# Patient Record
Sex: Male | Born: 1950 | Race: White | Hispanic: No | Smoking: Former smoker
Health system: Southern US, Community
[De-identification: ages and names within clinical notes are randomized; demographics above are authoritative.]

## PROBLEM LIST (undated history)

## (undated) DIAGNOSIS — A6000 Herpesviral infection of urogenital system, unspecified: Secondary | ICD-10-CM

## (undated) DIAGNOSIS — J449 Chronic obstructive pulmonary disease, unspecified: Secondary | ICD-10-CM

## (undated) DIAGNOSIS — E785 Hyperlipidemia, unspecified: Secondary | ICD-10-CM

## (undated) DIAGNOSIS — Z720 Tobacco use: Secondary | ICD-10-CM

## (undated) DIAGNOSIS — F1290 Cannabis use, unspecified, uncomplicated: Secondary | ICD-10-CM

## (undated) DIAGNOSIS — D369 Benign neoplasm, unspecified site: Secondary | ICD-10-CM

## (undated) DIAGNOSIS — N529 Male erectile dysfunction, unspecified: Secondary | ICD-10-CM

## (undated) HISTORY — DX: Benign neoplasm, unspecified site: D36.9

## (undated) HISTORY — DX: Herpesviral infection of urogenital system, unspecified: A60.00

## (undated) HISTORY — DX: Tobacco use: Z72.0

## (undated) HISTORY — DX: Hyperlipidemia, unspecified: E78.5

## (undated) HISTORY — PX: OTHER SURGICAL HISTORY: SHX169

## (undated) HISTORY — PX: CYST EXCISION: SHX5701

## (undated) HISTORY — PX: POLYPECTOMY: SHX149

## (undated) HISTORY — DX: Cannabis use, unspecified, uncomplicated: F12.90

## (undated) HISTORY — DX: Chronic obstructive pulmonary disease, unspecified: J44.9

## (undated) HISTORY — DX: Male erectile dysfunction, unspecified: N52.9

---

## 1973-08-14 HISTORY — PX: APPENDECTOMY: SHX54

## 2003-02-06 ENCOUNTER — Encounter (HOSPITAL_BASED_OUTPATIENT_CLINIC_OR_DEPARTMENT_OTHER): Payer: Self-pay | Admitting: General Surgery

## 2003-02-11 ENCOUNTER — Ambulatory Visit (HOSPITAL_COMMUNITY): Admission: RE | Admit: 2003-02-11 | Discharge: 2003-02-11 | Payer: Self-pay | Admitting: General Surgery

## 2007-11-15 ENCOUNTER — Encounter (HOSPITAL_BASED_OUTPATIENT_CLINIC_OR_DEPARTMENT_OTHER): Payer: Self-pay | Admitting: General Surgery

## 2007-11-15 ENCOUNTER — Ambulatory Visit (HOSPITAL_COMMUNITY): Admission: RE | Admit: 2007-11-15 | Discharge: 2007-11-15 | Payer: Self-pay | Admitting: General Surgery

## 2010-12-27 NOTE — Op Note (Signed)
NAME:  Keith Curtis, Keith Curtis                  ACCOUNT NO.:  [id number]   MEDICAL RECORD NO.:  [id number]          PATIENT TYPE:  AMB   LOCATION:  SDS                          FACILITY:  [facility]   PHYSICIAN:  Leonie Man, M.D.   DATE OF BIRTH:  09-22-1950   DATE OF PROCEDURE:  11/15/2007  DATE OF DISCHARGE:                               OPERATIVE REPORT   PREOPERATIVE DIAGNOSIS:  Right inguinal hernia   POSTOPERATIVE DIAGNOSIS:  Direct and indirect right inguinal hernias.   PROCEDURES:  Right inguinal hernia repair with mesh.   SURGEON:  Dr. Lurene Shadow.   ASSISTANT:  OR tech.   ANESTHESIA:  Is general.   SPECIMENS:  To the lab:  Inguinal hernia sac.  Gross diagnosis only.   ESTIMATED BLOOD LOSS:  Was minimal.   There were no complications.   NOTE:  Keith Curtis is a 60 year old man who is status post left inguinal  hernia repair in 2007.  He was recently noted to have right-sided groin  mass developing since December 2008.  This has become larger over the  ensuing period of time.  He comes to the operating room now for repair,  after the risks and potential benefits of surgery have been fully  discussed, all questions answered and consent obtained.   PROCEDURE:  The patient is positioned supinely, and following induction  of satisfactory general anesthesia, the right groin is prepped and  draped to be included in a sterile operative field.  Positive  identification of the patient is Keith Curtis, procedure to be done right  inguinal hernia repair, was carried out.   A transverse incision of the lower abdominal crease deepened through  skin and subcutaneous tissues, carrying the dissection down to the  external oblique aponeurosis.  The external oblique aponeurosis opened  up through the external and inguinal ring with protection of the  ilioinguinal nerve which was retracted superiorly and medially.  Spermatic cord was then elevated from the floor of the inguinal canal  and held with a  Penrose drain.  There was immediately noted a very large  direct inguinal hernia, and within the contents of the cord, there was a  large indirect inguinal hernia.  The cremasteric fibers of the cord were  dissected free, and the indirect inguinal hernia was dissected free from  the spermatic cord carrying the dissection superiorly and laterally to  the internal ring.  The sac was opened and emptied of any intra-  abdominal contents.  Sac was suture ligated at its neck with a 2-0 silk  suture.  The redundant sac was amputated and forwarded for pathologic  evaluation for gross diagnosis only.  The floor of the inguinal of the  inguinal canal was then repaired with an [place] patch of polypropylene  mesh which was designed and fashioned to fit into the inguinal canal  with the leaflets split so as to allow protrusion of the spermatic cord.  This was sewn in with a 2-0 Novofil starting at the pubic tubercle and  carrying the suture line along the conjoined tendon up to the internal  ring and again from the pubic tubercle, carrying the suture line up  along the shelving edge of Poupart's ligament to the internal ring.  The  tails of the mesh were then sutured down into the internal oblique  muscle behind the cord to completely close off of the internal ring.  This was tested to be sure that the spermatic cord was not in any way  compromised of its blood supply.  The tails of mesh were then extended  onto the external oblique aponeurosis.  Sponge, instrument, and sharp  counts were doubly verified.  The external oblique aponeurosis closed  with running suture of 2-0 Vicryl.  Scarpa's fascia closed with running  suture of 3-0 Vicryl and the skin closed with running suture of 4-0  Monocryl.  This was then reinforced with Steri-Strips, sterile dressings  were applied, the anesthetic reversed, and the patient removed from the  operating room to the recovery room in stable condition.  He tolerated   the procedure well.      Leonie Man, M.D.  Electronically Signed     PB/MEDQ  D:  11/15/2007  T:  11/15/2007  Job:  [id number]   cc:   Leonie Man, M.D.

## 2010-12-30 NOTE — Op Note (Signed)
NAME:  Keith Curtis, Keith Curtis NO.:  [id number]   MEDICAL RECORD NO.:  [id number]                   PATIENT TYPE:  OIB   LOCATION:  2899                                 FACILITY:  [facility]   PHYSICIAN:  Leonie Man, M.D.                DATE OF BIRTH:  June 20, 1951   DATE OF PROCEDURE:  02/11/2003  DATE OF DISCHARGE:                                 OPERATIVE REPORT   PREOPERATIVE DIAGNOSIS:  Recurrent left inguinal hernia.   POSTOPERATIVE DIAGNOSIS:  Recurrent left inguinal hernia.   OPERATION PERFORMED:  Repair of recurrent left inguinal hernia with mesh.   SURGEON:  Leonie Man, M.D.   ASSISTANT:  Nurse.   ANESTHESIA:  General.   INDICATIONS FOR PROCEDURE:  The patient is a 60 year old man who is status  post left inguinal hernia repair approximately 19 years ago having undergone  at that time a Technical brewer.  He returns now with a left-sided groin  bulge diagnosed as a hernia.  He comes to the operating room for repair  after the risks and potential benefits of surgery had been fully discussed,  all questions answered and consent obtained.   DESCRIPTION OF PROCEDURE:  Following the induction of satisfactory general  anesthesia, the patient positioned supine, then the left groin was prepped  and draped to be included in a sterile operative field.  A transverse  incision in the lower abdominal crease through the old scar cicatrix,  deepened through the skin and subcutaneous tissue, dissecting down to the  external oblique aponeurosis.  This was opened up through the external  inguinal ring.  There was no positive identification of the ilioinguinal  nerve.  All care was taken to try to avoid that area.  The left inguinal  hernia was immediately apparent in the direct space.  The spermatic cord was  dissected free from the region of the hernia.  The spermatic cord was held  with a Penrose drain.  The hernial defect was somewhat small and  funicular  and the hernia was easily reduced into the retroperitoneum.  This area was  then secured with a single suture of 2-0 Vicryl and then an onlay patch of  polypropylene mesh was sewn over the entire area of Hesselbach's triangle,  starting at the pubic tubercle with a 2-0 Novofil suture carrying the suture  line and mesh up along the conjoined tendon up to the internal ring and  again from the pubic tubercle with a 2-0 Novofil suture along the shelving  edge of Poupart's ligament up to the internal ring.  The mesh was split so  as to allow the cord to protrude through the split and the tails of the mesh  were then trimmed and secured down into the internal oblique muscles,  posterior to the spermatic cord.  All areas of dissection were then  thoroughly checked for hemostasis.  Sponge  and instrument counts were  verified and the wound closed in layers as follows.  The spermatic cord  returned to its usual anatomic position and the external oblique aponeurosis  closed with a running suture of 2-0 Vicryl thus reapproximating the external  ring.  Scarpa's fascia and subcutaneous fat was closed with a running 3-0  Vicryl suture  and the skin was closed with a 4-0 Monocryl suture.  The wound was then  reinforced with Steri-Strips.  Sterile dressings were applied.  Anesthetic  was reversed and the patient removed from the operating room to the recovery  room in stable condition, having tolerated the procedure well.                                                Leonie Man, M.D.    PB/MEDQ  D:  02/11/2003  T:  02/11/2003  Job:  [id number]

## 2011-05-09 LAB — DIFFERENTIAL
Basophils Absolute: 0
Eosinophils Relative: 7 — ABNORMAL HIGH
Lymphocytes Relative: 25
Lymphs Abs: 2.1
Monocytes Absolute: 0.7
Neutro Abs: 5

## 2011-05-09 LAB — COMPREHENSIVE METABOLIC PANEL
AST: 21
Albumin: 3.8
BUN: 12
Calcium: 8.8
Creatinine, Ser: 1.06
GFR calc Af Amer: >60
GFR calc non Af Amer: >60
Total Bilirubin: 0.7

## 2011-05-09 LAB — CBC
HCT: 46.7
MCHC: 34.2
MCV: 91.8
Platelets: 263

## 2015-11-10 ENCOUNTER — Ambulatory Visit (INDEPENDENT_AMBULATORY_CARE_PROVIDER_SITE_OTHER): Payer: PPO

## 2015-11-10 NOTE — Patient Instructions (Signed)
     IF you received an x-ray today, you will receive an invoice from [facility]. Please contact [facility] at [phone] with questions or concerns regarding your invoice.   IF you received labwork today, you will receive an invoice from [facility]. Please contact [facility] at [phone] with questions or concerns regarding your invoice.   Our billing staff will not be able to assist you with questions regarding bills from these companies.  You will be contacted with the lab results as soon as they are available. The fastest way to get your results is to activate your My Chart account. Instructions are located on the last page of this paperwork. If you have not heard from us regarding the results in 2 weeks, please contact this office.      

## 2016-08-31 ENCOUNTER — Ambulatory Visit: Payer: Self-pay | Admitting: Family Medicine

## 2016-09-01 ENCOUNTER — Ambulatory Visit (INDEPENDENT_AMBULATORY_CARE_PROVIDER_SITE_OTHER): Payer: PPO | Admitting: Family Medicine

## 2016-09-01 ENCOUNTER — Encounter: Payer: Self-pay | Admitting: Family Medicine

## 2016-09-01 VITALS — BP 104/72 | HR 81 | Temp 97.9°F | Ht 74.0 in | Wt 217.8 lb

## 2016-09-01 DIAGNOSIS — Z136 Encounter for screening for cardiovascular disorders: Secondary | ICD-10-CM

## 2016-09-01 DIAGNOSIS — Z0001 Encounter for general adult medical examination with abnormal findings: Secondary | ICD-10-CM | POA: Diagnosis not present

## 2016-09-01 DIAGNOSIS — F17200 Nicotine dependence, unspecified, uncomplicated: Secondary | ICD-10-CM | POA: Insufficient documentation

## 2016-09-01 DIAGNOSIS — Z7289 Other problems related to lifestyle: Secondary | ICD-10-CM

## 2016-09-01 DIAGNOSIS — Z720 Tobacco use: Secondary | ICD-10-CM

## 2016-09-01 DIAGNOSIS — F1290 Cannabis use, unspecified, uncomplicated: Secondary | ICD-10-CM | POA: Diagnosis not present

## 2016-09-01 DIAGNOSIS — D1722 Benign lipomatous neoplasm of skin and subcutaneous tissue of left arm: Secondary | ICD-10-CM | POA: Insufficient documentation

## 2016-09-01 DIAGNOSIS — N529 Male erectile dysfunction, unspecified: Secondary | ICD-10-CM | POA: Insufficient documentation

## 2016-09-01 DIAGNOSIS — M1710 Unilateral primary osteoarthritis, unspecified knee: Secondary | ICD-10-CM | POA: Insufficient documentation

## 2016-09-01 DIAGNOSIS — M25562 Pain in left knee: Secondary | ICD-10-CM

## 2016-09-01 DIAGNOSIS — A6000 Herpesviral infection of urogenital system, unspecified: Secondary | ICD-10-CM | POA: Insufficient documentation

## 2016-09-01 DIAGNOSIS — G8929 Other chronic pain: Secondary | ICD-10-CM

## 2016-09-01 DIAGNOSIS — M179 Osteoarthritis of knee, unspecified: Secondary | ICD-10-CM | POA: Insufficient documentation

## 2016-09-01 MED ORDER — TETANUS-DIPHTH-ACELL PERTUSSIS 5-2.5-18.5 LF-MCG/0.5 IM SUSP
0.5000 mL | Freq: Once | INTRAMUSCULAR | 0 refills | Status: AC
Start: 2016-09-01 — End: 2016-09-01

## 2016-09-01 NOTE — Progress Notes (Signed)
Pre visit review using our clinic review tool, if applicable. No additional management support is needed unless otherwise documented below in the visit note. 

## 2016-09-01 NOTE — Progress Notes (Signed)
Phone: [phone]  Subjective:  Patient presents today to establish care. 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 Chief complaint-noted.   See problem oriented charting  The following were reviewed and entered/updated in epic: Past Medical History:  Diagnosis Date  . Erectile dysfunction    buys sildenafil from [place] or other- 40 or 60mg   . Genital herpes    last outbreak 2012 or before  . 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 Tobacco abuse    1.5 PPD. uses as a crutch. not ready to quit   Patient Active Problem List   Diagnosis Date Noted  . Tobacco abuse     Priority: High  . Marijuana use     Priority: Medium  . Genital herpes     Priority: Low  . Erectile dysfunction     Priority: Low  . Chronic pain of left knee 09/01/2016  . Lipoma of left shoulder 09/01/2016   Past Surgical History:  Procedure Laterality Date  . APPENDECTOMY  1975  . hernia bilateral     inguinal- both sides- 3 surgeries total. around 2009 last surgery.     Family History  Problem Relation Age of Onset  . Other Mother     died in 32s- unknown cause  . Heart attack Father     died at age 86  . Stroke Father     50    Medications- reviewed and updated Current Outpatient Prescriptions  Medication Sig Dispense Refill  . Tdap (BOOSTRIX) 5-2.5-18.5 LF-MCG/0.5 injection Inject 0.5 mLs into the muscle once. 0.5 mL 0   No current facility-administered medications for this visit.     Allergies-reviewed and updated No Known Allergies  Social History   Social History  . Marital status: Divorced    Spouse name: N/A  . Number of children: N/A  . Years of education: N/A   Social History Main Topics  . Smoking status: Current Every Day Smoker    Packs/day: 1.50    Years: 30.00    Types: Cigarettes  . Smokeless tobacco: Never Used  . Alcohol use 2.4 - 3.0 oz/week    4 - 5 Standard drinks or equivalent per week  . Drug use: Unknown     Comment: marijuana  .  Sexual activity: Yes     Comment: GF already has genital herpes   Other Topics Concern  . None   Social History Narrative   Divorced. GF 10 years in 2018. Son from prior marriage. No grandkids.       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       HObbies: time with gf, playing guitar, [place]. Enjoys hiking.     ROS--Full ROS was completed Review of Systems  Constitutional: Negative for chills and fever.  HENT: Negative for hearing loss and tinnitus.   Eyes: Negative for blurred vision and double vision.  Respiratory: Negative for shortness of breath and wheezing.   Cardiovascular: Negative for chest pain and palpitations.  Gastrointestinal: Negative for heartburn and nausea.  Genitourinary: Negative for dysuria and urgency.  Musculoskeletal: Negative for myalgias and neck pain.  Skin: Negative for itching and rash.  Neurological: Negative for dizziness and headaches.  Endo/Heme/Allergies: Negative for polydipsia. Does not bruise/bleed easily.  Psychiatric/Behavioral: Positive for substance abuse (marijuana use). Negative for hallucinations.   Objective: BP 104/72 (BP Location: Left Arm, Patient  Position: Sitting, Cuff Size: Large)   Pulse 81   Temp 97.9 F (36.6 C) (Oral)   Ht 6\' 2"  (1.88 m)   Wt 217 lb 12.8 oz (98.8 kg)   SpO2 95%   BMI 27.96 kg/m  Gen: NAD, resting comfortably, smells of smoke HEENT: Mucous membranes are moist. Oropharynx normal. TM normal. Eyes: sclera and lids normal, PERRLA Neck: no thyromegaly, no cervical lymphadenopathy CV: RRR no murmurs rubs or gallops Lungs: CTAB no crackles, wheeze, rhonchi Abdomen: soft/nontender/nondistended/normal bowel sounds. No rebound or guarding.  Ext: no edema Skin: warm, dry Neuro: 5/5 strength in upper and lower extremities, normal gait, normal reflexes  Assessment/Plan:  66 y.o. male presenting for annual physical.  Health  Maintenance counseling: 1. Anticipatory guidance: Patient counseled regarding regular dental exams, eye exams, wearing seatbelts.  2. Risk factor reduction:  Advised patient of need for regular exercise and diet rich and fruits and vegetables to reduce risk of heart attack and stroke.  3. Immunizations/screenings/ancillary studies Health Maintenance Due  Topic Date Due  . Hepatitis C Screening - with labs [birth year]  . TETANUS/TDAP - take rx to pharmacy (may not have part D insurance) 07/01/1969  . COLONOSCOPY- opts for cologuard  07/01/2000  . ZOSTAVAX - wait on shingrix 07/01/2010  . PNA vac Low Risk Adult (1 of 2 - PCV13)- declines for now 07/02/2015  4. Prostate cancer screening- did not complete rectal exam today or plan for PSA, instead we focused on his highest risk which is smoking/lung cancer risk. See below  5. Colon cancer screening - cologuard opts in. Firmly declines colonoscopy  Chronic pain of left knee S: Patient with bilateral knee pain for years. Has known torn ACL from years ago. He likes to go hiking and no issues going up but when he comes down he feels like his knee might buckle. This on the left with torn ACL but also on the right side. This has limited his exercise O: Left Knee: positive anterior drawer. LCL, PCL, MCL stable, negative mcmurrays. Crepitus on exam Right knee. Ligaments stable. Mcmurrays produces a click but minimal pain. There is also significant crepitus at this point in maneuver. Crepitus with range of motion knee as well A/P: Suspect ACL remains torn based on exam. Likely accounts for unstable feeling on left. 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 Refer to sports medicine for further evaluation of both knees. LIkely x-ray and consideration of ultrasound as well.   Tobacco abuse Strongly encouraged cessation. has quit 10 weeks sometime after 2010- stress of relationship led himback. very sick when quit. He  is not ready to quit at present- with how poorly he did off of nicotine we discussed using 21mg  patch as well as 2mg  gum method. Declines lung cancer screening program. Will need AAA screen offered in future.   Marijuana use almost on daily basis. Advised cessation- not ready  Lipoma of left shoulder 5x 4 cm on left shoulder- not ready for surgical consultation  1 year physical advised at latest.  awv 3 months advised to follow up unhealthy lifestyles   Will return for fasting labs Orders Placed This Encounter  Procedures  . CBC    Standing Status:   Future    Standing Expiration Date:   09/01/2017  . Comprehensive metabolic panel    [facility]    Standing Status:   Future    Standing Expiration Date:   09/01/2017  . Lipid  panel    Standing Status:   Future    Standing Expiration Date:   09/01/2017  . Hepatitis C antibody, reflex    [facility]    Standing Status:   Future    Standing Expiration Date:   09/01/2017  . Ambulatory referral to Sports Medicine    Referral Priority:   Routine    Referral Type:   Consultation    Referred to Provider:   Gerda Diss, DO    Number of Visits Requested:   1    Meds ordered this encounter  Medications  . Tdap (BOOSTRIX) 5-2.5-18.5 LF-MCG/0.5 injection    Sig: Inject 0.5 mLs into the muscle once.    Dispense:  0.5 mL    Refill:  0    Return precautions advised.  Garret Reddish, MD

## 2016-09-01 NOTE — Assessment & Plan Note (Addendum)
Strongly encouraged cessation. has quit 10 weeks sometime after 2010- stress of relationship led himback. very sick when quit. He is not ready to quit at present- with how poorly he did off of nicotine we discussed using 21mg  patch as well as 2mg  gum method. Declines lung cancer screening program. Will need AAA screen offered in future.

## 2016-09-01 NOTE — Assessment & Plan Note (Addendum)
almost on daily basis. Advised cessation- not ready

## 2016-09-01 NOTE — Assessment & Plan Note (Signed)
5x 4 cm on left shoulder- not ready for surgical consultation

## 2016-09-01 NOTE — Assessment & Plan Note (Signed)
S: Patient with bilateral knee pain for years. Has known torn ACL from years ago. He likes to go hiking and no issues going up but when he comes down he feels like his knee might buckle. This on the left with torn ACL but also on the right side. This has limited his exercise O: Left Knee: positive anterior drawer. LCL, PCL, MCL stable, negative mcmurrays. Crepitus on exam Right knee. Ligaments stable. Mcmurrays produces a click but minimal pain. There is also significant crepitus at this point in maneuver. Crepitus with range of motion knee as well A/P: Suspect ACL remains torn based on exam. Likely accounts for unstable feeling on left. 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 Refer to sports medicine for further evaluation of both knees. LIkely x-ray and consideration of ultrasound as well.

## 2016-09-01 NOTE — Patient Instructions (Addendum)
Tdap today  Roselyn Reef will help set you up for cologuard  We will call you within a week or two about your referral to sports medicine. If you do not hear within 3 weeks, give [place] a call.   Let us know if you become ready to have surgical consult for lipoma  Schedule a lab visit at the check out desk within 2 weeks. Return for future fasting labs meaning nothing but water after midnight please. Ok to take your medications with water.

## 2016-09-08 ENCOUNTER — Inpatient Hospital Stay: Admission: RE | Admit: 2016-09-08 | Payer: Self-pay | Source: Ambulatory Visit

## 2016-09-08 ENCOUNTER — Ambulatory Visit (INDEPENDENT_AMBULATORY_CARE_PROVIDER_SITE_OTHER): Payer: PPO

## 2016-09-08 ENCOUNTER — Other Ambulatory Visit: Payer: Self-pay

## 2016-09-08 ENCOUNTER — Ambulatory Visit: Payer: Self-pay

## 2016-09-08 ENCOUNTER — Ambulatory Visit (INDEPENDENT_AMBULATORY_CARE_PROVIDER_SITE_OTHER): Payer: PPO | Admitting: Sports Medicine

## 2016-09-08 ENCOUNTER — Encounter: Payer: Self-pay | Admitting: Sports Medicine

## 2016-09-08 VITALS — BP 112/80 | HR 78 | Ht 74.0 in | Wt 215.0 lb

## 2016-09-08 DIAGNOSIS — M170 Bilateral primary osteoarthritis of knee: Secondary | ICD-10-CM | POA: Diagnosis not present

## 2016-09-08 DIAGNOSIS — M25561 Pain in right knee: Secondary | ICD-10-CM

## 2016-09-08 DIAGNOSIS — M25562 Pain in left knee: Secondary | ICD-10-CM

## 2016-09-08 DIAGNOSIS — G8929 Other chronic pain: Secondary | ICD-10-CM | POA: Diagnosis not present

## 2016-09-08 DIAGNOSIS — M179 Osteoarthritis of knee, unspecified: Secondary | ICD-10-CM | POA: Diagnosis not present

## 2016-09-08 DIAGNOSIS — N529 Male erectile dysfunction, unspecified: Secondary | ICD-10-CM

## 2016-09-08 NOTE — Progress Notes (Signed)
Keith Curtis - 66 y.o. male MRN [id number]  Date of birth: Mar 05, 1951  Office Visit Note: Visit Date: 09/08/2016 PCP: Garret Reddish, MD Referred by: Marin Olp, MD  Subjective: Chief Complaint  Patient presents with  . Knee Pain    bilateral   HPI: Adult male with several months of worsening knee instability-type symptoms.  He has had intermittent symptoms for quite some time but is worsened over the past several months when he is in trying to increase his walking activities.  Reports a going down steps is the most difficult.  He has not tried any specific therapeutic exercises or bracing.    Review of systems: Denies any catching, locking or falls associated with his discomfort. Denies fevers, chills, recent weight gain or weight loss.  No night sweats. No significant nighttime awakenings due to this issue.    Objective:  VS:  HT:6\' 2"  (188 cm)   WT:215 lb (97.5 kg)  BMI:27.7    BP:112/80  HR:78bpm  TEMP: ( )  RESP:96 % Physical Exam: Adult male. Alert and appropriate.  In no acute distress.  Lower extremities are overall well aligned with no significant deformity. No significant swelling.  Distal pulses 2+/4. No significant bruising/ecchymosis or erythema the skin Bilateral knees: Overall joint is well aligned, no significant deformity.   Mild effusions bilaterally right greater than left. ROM: 0 to 120.  Extensor mechanisms intact Mild generalized TTP over bilateral medial and lateral joint lines.  Positive patellar grind.   He has 3-4 mm of opening bilaterally with valgus testing but has a solid endpoint.  Anterior-posterior drawer stable.  Normal Lachman's.   Negative McMurray's and Thessaly.   Imaging: Dg Knee 1-2 Views Left  Result Date: 09/11/2016 CLINICAL DATA:  Chronic bilateral knee pain. EXAM: LEFT KNEE - 1-2 VIEW COMPARISON:  None. FINDINGS: No evidence of fracture, dislocation, or joint effusion. Mild narrowing of the medial and lateral joint space is  noted with osteophyte formation. Soft tissues are unremarkable. IMPRESSION: Mild degenerative joint disease. No acute abnormality seen in the left knee. Electronically Signed   By: Marijo Conception, M.D.   On: 09/11/2016 08:33   Dg Knee 1-2 Views Right  Result Date: 09/11/2016 CLINICAL DATA:  Chronic bilateral knee pain. EXAM: RIGHT KNEE - 1-2 VIEW COMPARISON:  None. FINDINGS: No evidence of fracture, dislocation, or joint effusion. Moderate narrowing of lateral joint space is noted with osteophyte formation. Mild spurring of patella is noted. Large loose body is seen anteriorly within the joint space. Soft tissues are unremarkable. IMPRESSION: Moderate degenerative joint disease is noted laterally. Large loose body is seen anteriorly within the joint space. No fracture or dislocation is noted. Electronically Signed   By: Marijo Conception, M.D.   On: 09/11/2016 08:36   US Guided Needle Placement  Result Date: 09/09/2016 Ultrasound-guided right knee aspiration & injection with left knee injection only: The patient's clinical condition is marked by substantial pain and/or significant functional disability. Other conservative therapy has not provided relief, is contraindicated, or not appropriate. There is a reasonable likelihood that injection will significantly improve the patient's pain and/or functional impairment.  After discussing the risks, benefits and expected outcomes of the injection and all questions were reviewed and answered, the patient wished to undergo the above named procedures.  Verbal consent was obtained. Right knee:  The right knee was prepped in sterile fashion with alcohol scrub. Using 5 mL of 1% lidocaine a skin wheal & local tract with nephrolithiasis  via the superior lateral portal. After adequate anesthesia was obtained using 18g needle 5 mL of serous fluid was aspirated without difficulty. Using sterile technique syringes were exchanged & 2 mL of half sent Marcaine & 1 mL of 40mg   Depo-Medrol was injected & knee without difficulty. Left knee:  Left knee was prepped in sterile fashion with alcohol scrub. Using 2 mL of 0.5% Marcaine & 1 mL of 40mg  Depo-Medrol on a 25g 1-1/2 inch needle the left knee & injected via the superior lateral portal. Band-Aid was applied & the patient tolerated this procedure well. The patient tolerated this procedure well with no immediate complications. Post injection instructions were provided. Images & stored via the PACS system.   1. Technically successful right knee aspiration & injection. 2. Technically successful left knee injection     Assessment & Plan: Visit Diagnoses:    ICD-9-CM ICD-10-CM   1. Chronic pain of both knees 719.46 M25.561 DG Knee 1-2 Views Left   338.29 M25.562 DG Knee 1-2 Views Right    G89.29 DG Knee 1-2 Views Left     DG Knee 1-2 Views Right  2. Primary osteoarthritis of both knees 715.16 M17.0 US Guided Needle Placement  3. Erectile dysfunction, unspecified erectile dysfunction type 607.84 N52.9 sildenafil (VIAGRA) 50 MG tablet    Knee osteoarthritis Patient is fairly severe bilateral knee arthritis with large loose body in the right anterior joint space.  He is not having any significant mechanical symptoms at this time does feel slightly unstable.  Discussion around therapeutic exercises, use of body use compression sleeve and other strengthening activities discussed.  > Consider: Serial intermittent injections versus total knee arthroplasty.     Procedures:  See imaging notes.  Follow-up: Return when symptoms worsen or fail to improve.    Past Medical/Family/Surgical/Social History: Medications & Allergies reviewed per EMR Patient Active Problem List   Diagnosis Date Noted  . Knee osteoarthritis 09/01/2016  . Lipoma of left shoulder 09/01/2016  . Tobacco abuse   . Marijuana use   . Genital herpes   . Erectile dysfunction    Past Medical History:  Diagnosis Date  . Erectile dysfunction    buys  sildenafil from [place] or other- 40 or 60mg   . Genital herpes    last outbreak 2012 or before  . 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 Tobacco abuse    1.5 PPD. uses as a crutch. not ready to quit   Family History  Problem Relation Age of Onset  . Other Mother     died in 10s- unknown cause  . Heart attack Father     died at age 78  . Stroke Father     44   Past Surgical History:  Procedure Laterality Date  . APPENDECTOMY  1975  . hernia bilateral     inguinal- both sides- 3 surgeries total. around 2009 last surgery.    Social History   Occupational History  . Not on file.   Social History Main Topics  . Smoking status: Current Every Day Smoker    Packs/day: 1.50    Years: 30.00    Types: Cigarettes  . Smokeless tobacco: Never Used  . Alcohol use 2.4 - 3.0 oz/week    4 - 5 Standard drinks or equivalent per week  . Drug use: Unknown     Comment: marijuana  . Sexual activity: Yes     Comment: GF already has genital herpes

## 2016-09-13 NOTE — Assessment & Plan Note (Signed)
Patient is fairly severe bilateral knee arthritis with large loose body in the right anterior joint space.  He is not having any significant mechanical symptoms at this time does feel slightly unstable.  Discussion around therapeutic exercises, use of body use compression sleeve and other strengthening activities discussed.  > Consider: Serial intermittent injections versus total knee arthroplasty.

## 2016-09-15 ENCOUNTER — Other Ambulatory Visit: Payer: PPO

## 2016-09-20 ENCOUNTER — Ambulatory Visit: Payer: PPO | Admitting: Family Medicine

## 2016-09-22 ENCOUNTER — Other Ambulatory Visit: Payer: PPO

## 2016-11-27 NOTE — Progress Notes (Deleted)
Subjective:   Keith Curtis is a 66 y.o. male who presents for an Initial Medicare Annual Wellness Visit.  The Patient was informed that the wellness visit is to identify future health risk and educate and initiate measures that can reduce risk for increased disease through the lifespan.    NO ROS; Medicare Wellness Visit  Describes health as good, fair or great?   Preventive Screening -Counseling & Management   Smoking history - Current every day smoker 45 pack years Current Marijuana  30 pack hx: Educated regarding LDCT if applicable with a 30 year hx Also educated regarding AAA for male tobacco users with smoking hx -  Smokeless tobacco  Second Hand Smoke status; No Smokers in the home ETOH  Medication adherence or issues?   RISK FACTORS Diet Regular exercise   Cardiac Risk Factors:  Advanced aged > 67 in men; >65 in women Hyperlipidemia - MI , Stroke Diabetes Family History  Obesity  Fall risk  Given education on "Fall Prevention in the Home" for more safety tips the patient can apply as appropriate.  Long term goal is to "age in place" or undecided   Mobility of Functional changes this year? Safety; community, wears sunscreen, safe place for firearms; Motor vehicle accidents;   Mental Health:  Any emotional problems? Anxious, depressed, irritable, sad or blue?  Denies feeling depressed or hopeless; voices pleasure in daily life How many social activities have you been engaged in within the last 2 weeks? Who would help you with chores; illness; shopping other?  Eye exam   Activities of Daily Living - See functional screen   Cognitive testing; Ad8 score; 0 or less than 2  MMSE deferred or completed if AD8 + 2 issues  Advanced Directives   List the name of Physicians or other Practitioners you currently use:    There is no immunization history on file for this patient. Required Immunizations needed today  Screening test up to date or reviewed for  plan of completion Health Maintenance Due  Topic Date Due  . Hepatitis C Screening  03-Sep-1950  . TETANUS/TDAP  07/01/1969  . COLONOSCOPY  07/01/2000  . PNA vac Low Risk Adult (1 of 2 - PCV13) 07/02/2015         Objective:    There were no vitals filed for this visit. There is no height or weight on file to calculate BMI.  Current Medications (verified) Outpatient Encounter Prescriptions as of 11/28/2016  Medication Sig  . sildenafil (VIAGRA) 50 MG tablet Take 50 mg by mouth daily as needed for erectile dysfunction.   No facility-administered encounter medications on file as of 11/28/2016.     Allergies (verified) Patient has no known allergies.   History: Past Medical History:  Diagnosis Date  . Erectile dysfunction    buys sildenafil from [place] or other- 40 or 60mg   . Genital herpes    last outbreak 2012 or before  . 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 Tobacco abuse    1.5 PPD. uses as a crutch. not ready to quit   Past Surgical History:  Procedure Laterality Date  . APPENDECTOMY  1975  . hernia bilateral     inguinal- both sides- 3 surgeries total. around 2009 last surgery.    Family History  Problem Relation Age of Onset  . Other Mother     died in 98s- unknown cause  . Heart attack Father     died at age 56  .  Stroke Father     16   Social History   Occupational History  . Not on file.   Social History Main Topics  . Smoking status: Current Every Day Smoker    Packs/day: 1.50    Years: 30.00    Types: Cigarettes  . Smokeless tobacco: Never Used  . Alcohol use 2.4 - 3.0 oz/week    4 - 5 Standard drinks or equivalent per week  . Drug use: Unknown     Comment: marijuana  . Sexual activity: Yes     Comment: GF already has genital herpes   Tobacco Counseling Ready to quit: Not Answered Counseling given: Not Answered   Activities of Daily Living No flowsheet data found.  Immunizations and Health Maintenance  There is no  immunization history on file for this patient. Health Maintenance Due  Topic Date Due  . Hepatitis C Screening  07-03-1951  . TETANUS/TDAP  07/01/1969  . COLONOSCOPY  07/01/2000  . PNA vac Low Risk Adult (1 of 2 - PCV13) 07/02/2015    Patient Care Team: Marin Olp, MD as PCP - General (Family Medicine)  Indicate any recent Medical Services you may have received from other than [facility] providers in the past year (date may be approximate).    Assessment:   This is a routine wellness examination for Keith Curtis. ***  Hearing/Vision screen No exam data present  Dietary issues and exercise activities discussed:    Goals    None     Depression Screen PHQ 2/9 Scores 09/01/2016 11/10/2015 11/10/2015  PHQ - 2 Score 0 0 0    Fall Risk Fall Risk  09/01/2016  Falls in the past year? No    Cognitive Function:        Screening Tests Health Maintenance  Topic Date Due  . Hepatitis C Screening  December 03, 1950  . TETANUS/TDAP  07/01/1969  . COLONOSCOPY  07/01/2000  . PNA vac Low Risk Adult (1 of 2 - PCV13) 07/02/2015  . INFLUENZA VACCINE  03/14/2017        Plan:   ***  During the course of the visit [name] was educated and counseled about the following appropriate screening and preventive services:   Vaccines to include Pneumoccal, Influenza, Hepatitis B, Td, Zostavax, HCV  Electrocardiogram  Colorectal cancer screening  Cardiovascular disease screening  Diabetes screening  Glaucoma screening  Nutrition counseling  Prostate cancer screening  Smoking cessation counseling  Patient Instructions (the written plan) were given to the patient.   Wynetta Fines, RN   11/27/2016

## 2016-11-28 ENCOUNTER — Ambulatory Visit: Payer: PPO

## 2017-09-06 NOTE — Progress Notes (Signed)
I called the patient to schedule but was unable to reach the patient or leave a message. I will try again later.

## 2017-09-07 NOTE — Progress Notes (Signed)
Patient is scheduled   

## 2017-09-07 NOTE — Progress Notes (Signed)
The patient has been scheduled

## 2017-09-25 ENCOUNTER — Encounter: Payer: Self-pay | Admitting: Family Medicine

## 2017-09-25 ENCOUNTER — Encounter: Payer: Self-pay | Admitting: Internal Medicine

## 2017-09-25 ENCOUNTER — Ambulatory Visit (INDEPENDENT_AMBULATORY_CARE_PROVIDER_SITE_OTHER): Payer: PPO | Admitting: Family Medicine

## 2017-09-25 VITALS — BP 108/78 | HR 64 | Temp 97.3°F | Ht 74.0 in | Wt 219.8 lb

## 2017-09-25 DIAGNOSIS — Z1211 Encounter for screening for malignant neoplasm of colon: Secondary | ICD-10-CM | POA: Diagnosis not present

## 2017-09-25 DIAGNOSIS — R5383 Other fatigue: Secondary | ICD-10-CM | POA: Diagnosis not present

## 2017-09-25 DIAGNOSIS — F17200 Nicotine dependence, unspecified, uncomplicated: Secondary | ICD-10-CM | POA: Diagnosis not present

## 2017-09-25 DIAGNOSIS — M542 Cervicalgia: Secondary | ICD-10-CM | POA: Diagnosis not present

## 2017-09-25 DIAGNOSIS — Z13220 Encounter for screening for lipoid disorders: Secondary | ICD-10-CM | POA: Diagnosis not present

## 2017-09-25 DIAGNOSIS — D1722 Benign lipomatous neoplasm of skin and subcutaneous tissue of left arm: Secondary | ICD-10-CM

## 2017-09-25 DIAGNOSIS — F1290 Cannabis use, unspecified, uncomplicated: Secondary | ICD-10-CM | POA: Diagnosis not present

## 2017-09-25 DIAGNOSIS — Z1159 Encounter for screening for other viral diseases: Secondary | ICD-10-CM | POA: Diagnosis not present

## 2017-09-25 LAB — POC URINALSYSI DIPSTICK (AUTOMATED)
Bilirubin, UA: NEGATIVE
GLUCOSE UA: NEGATIVE
Ketones, UA: NEGATIVE
Leukocytes, UA: NEGATIVE
NITRITE UA: NEGATIVE
PH UA: 6 (ref 5.0–8.0)
PROTEIN UA: NEGATIVE
RBC UA: NEGATIVE
Urobilinogen, UA: 0.2 E.U./dL

## 2017-09-25 NOTE — Assessment & Plan Note (Signed)
Encouraged cessation- not ready to quit

## 2017-09-25 NOTE — Assessment & Plan Note (Signed)
Smoking cessation counseling- 3 minutes spent on this topic Ask - still smoking 1-1.5 cigarettes per day. Marijuana use- not ready to quit either Advise - Strongly encouraged smoking cessation and discussed long term risks to health including cancer and cardiovascular riskI Assess - [name] Assist - patient not ready to move forward with cessation, he agrees to allow me to ask each visit Arrange - discussed physical within 6 months to check back in

## 2017-09-25 NOTE — Assessment & Plan Note (Signed)
S: Lipoma noted on last years visit at 5 x 4 cm on left shoulder- this year it has slightly increased in size. He asks about surgical removal A/P: Will refer to general surgery with lipoma now up to 5.5 x 5.5 cm. He is not in any pain with it but the growth is potentially concerning.

## 2017-09-25 NOTE — Progress Notes (Signed)
Subjective:  Keith Curtis is a 67 y.o. year old very pleasant male patient who presents for/with See problem oriented charting ROS- neck pain, perception of leg weakness with standing. No fever or chills or unintentional weight loss.   Past Medical History-  Patient Active Problem List   Diagnosis Date Noted  . Tobacco dependence     Priority: High  . Lipoma of left shoulder 09/01/2016    Priority: Medium  . Marijuana use     Priority: Medium  . Knee osteoarthritis 09/01/2016    Priority: Low  . Genital herpes     Priority: Low  . Erectile dysfunction     Priority: Low    Medications- reviewed and updated Current Outpatient Medications  Medication Sig Dispense Refill  . sildenafil (VIAGRA) 50 MG tablet Take 50 mg by mouth daily as needed for erectile dysfunction.     No current facility-administered medications for this visit.     Objective: BP 108/78 (BP Location: Left Arm, Patient Position: Sitting, Cuff Size: Large)   Pulse 64   Temp (!) 97.3 F (36.3 C) (Oral)   Ht 6\' 2"  (1.88 m)   Wt 219 lb 12.8 oz (99.7 kg)   SpO2 95%   BMI 28.22 kg/m  Gen: NAD, resting comfortably CV: RRR no murmurs rubs or gallops, 2 + PT pulses Lungs: CTAB no crackles, wheeze, rhonchi Abdomen: soft/nontender/nondistended/normal bowel sounds.  Ext: no edema Skin: warm, dry, left shoulder 5.5 x 5.5 cm raised mobile subcutaneous lesion.  MSK: tension noted in right side of neck Normal gait, 5/5 strength lower extremities. Does seem to struggle slightly to stand without hand assist- we practiced this and appeared to improve  Assessment/Plan:  Health Maintenance Due  Topic Date Due  . Hepatitis C Screening - with labs [birth year]  . TETANUS/TDAP - declines 07/01/1969  . COLONOSCOPY - agrees to colonoscopy after prolonged discussion 07/01/2000  . PNA vac Low Risk Adult (1 of 2 - PCV13)- declines firmly 07/02/2015   Neck pain S: Patient with intermittent right neck tension for several  months. Saw chiropractor for right neck tension- helped some. Heat helps.  A/P: offered trial muscle relaxant which he declines. 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 No pain in legs with walking. Feels more fatigued after a days work than he used to. Feels like its harder to stand without using his hands  A/P: for weakness feeling in legs- discussed standing without assist with hands 10 reps and building up to 3x a day. Offered PT which he declines- for fatigue get basic labs. Not fasting- but as getting labs also screen for hyperlipidemia  Lipoma of left shoulder S: Lipoma noted on last years visit at 5 x 4 cm on left shoulder- this year it has slightly increased in size. He asks about surgical removal A/P: Will refer to general surgery with lipoma now up to 5.5 x 5.5 cm. He is not in any pain with it but the growth is potentially concerning.   Tobacco dependence Smoking cessation counseling- 3 minutes spent on this topic Ask - still smoking 1-1.5 cigarettes per day. Marijuana use- not ready to quit either Advise - Strongly encouraged smoking cessation and discussed long term risks to health including cancer and cardiovascular riskI Assess - [name] Assist -  patient not ready to move forward with cessation, he agrees to allow me to ask each visit Arrange - discussed physical within 6 months to check back in   Marijuana use Encouraged cessation- not ready to quit   Future Appointments  Date Time Provider [place]  11/14/2017  2:30 PM [facility] PREVISIT RM50 [facility]  11/28/2017  9:30 AM Pyrtle, Lajuan Lines, MD [facility]  03/25/2018  2:15 PM Marin Olp, MD [facility]  cpe 6 months- will need to discuss awv need as well  Lab/Order associations: Neck pain - Plan:  CBC, Comprehensive metabolic panel  Other fatigue - Plan: CBC, Comprehensive metabolic panel  Screening for hyperlipidemia - Plan: Lipid panel  Lipoma of left shoulder - Plan: Ambulatory referral to General Surgery  Screen for colon cancer - Plan: Ambulatory referral to Gastroenterology  Tobacco dependence - Plan: POCT Urinalysis Dipstick (Automated)  Encounter for HCV screening test for low risk patient - Plan: Hepatitis C antibody  Time Stamp The duration of face-to-face time during this visit was greater than 25 minutes. Greater than 50% of this time was spent in counseling, explanation of diagnosis, planning of further management, and/or coordination of care including discussion of potential lipoma removal, health maintenance counseling particularly in discussion about need for colonoscopy and immunizations. Of note- 3 minutes counseling for tobacco dependence was in addition to 25 minutes listed above.    Return precautions advised.  Garret Reddish, MD

## 2017-09-25 NOTE — Patient Instructions (Addendum)
We will call you within a week or two about your referral to general surgery to discuss the lipoma and potential removal. If you do not hear within 3 weeks, give [place] a call.   Check pulses and leg strength  Orally advised physical within 6 months  Please stop by lab before you go

## 2017-09-26 LAB — COMPREHENSIVE METABOLIC PANEL
ALT: 12 U/L (ref 0–53)
AST: 14 U/L (ref 0–37)
Albumin: 4.2 g/dL (ref 3.5–5.2)
Alkaline Phosphatase: 66 U/L (ref 39–117)
BUN: 20 mg/dL (ref 6–23)
CALCIUM: 9 mg/dL (ref 8.4–10.5)
CHLORIDE: 101 meq/L (ref 96–112)
CO2: 29 meq/L (ref 19–32)
CREATININE: 1.02 mg/dL (ref 0.40–1.50)
GFR: 77.37 mL/min (ref >60.00)
Glucose, Bld: 89 mg/dL (ref 70–99)
POTASSIUM: 4.8 meq/L (ref 3.5–5.1)
Sodium: 138 mEq/L (ref 135–145)
Total Bilirubin: 0.5 mg/dL (ref 0.2–1.2)
Total Protein: 6.7 g/dL (ref 6.0–8.3)

## 2017-09-26 LAB — LIPID PANEL
CHOL/HDL RATIO: 3
Cholesterol: 161 mg/dL (ref 0–200)
HDL: 52.7 mg/dL (ref >39.00)
LDL Cholesterol: 75 mg/dL (ref 0–99)
NonHDL: 108.24
TRIGLYCERIDES: 167 mg/dL — AB (ref 0.0–149.0)
VLDL: 33.4 mg/dL (ref 0.0–40.0)

## 2017-09-26 LAB — CBC
HCT: 47.8 % (ref 39.0–52.0)
Hemoglobin: 15.9 g/dL (ref 13.0–17.0)
MCHC: 33.4 g/dL (ref 30.0–36.0)
MCV: 93.8 fl (ref 78.0–100.0)
PLATELETS: 253 10*3/uL (ref 150.0–400.0)
RBC: 5.09 Mil/uL (ref 4.22–5.81)
RDW: 13.9 % (ref 11.5–15.5)
WBC: 6.2 10*3/uL (ref 4.0–10.5)

## 2017-09-26 LAB — HEPATITIS C ANTIBODY
Hepatitis C Ab: NONREACTIVE
SIGNAL TO CUT-OFF: 0.02 (ref <1.00)

## 2017-10-18 ENCOUNTER — Ambulatory Visit: Payer: Self-pay | Admitting: General Surgery

## 2017-10-18 DIAGNOSIS — L729 Follicular cyst of the skin and subcutaneous tissue, unspecified: Secondary | ICD-10-CM | POA: Diagnosis not present

## 2017-10-18 NOTE — H&P (Signed)
History of Present Illness Ralene Ok MD; 10/18/2017 11:50 AM) The patient is a 67 year old male who presents with a complaint of back cyst. Patient is a 67 year old male who states she's had a multiple year history of a left back cyst. Patient states that at one point did decompress on its own. He states is been no erythema to it recently. He states it has gotten bigger more cumbersome. Patient denies any recent drainage from the area.     Past Surgical History (Tanisha A. Owens Shark, [place]; 10/18/2017 11:42 AM) Appendectomy  Open Inguinal Hernia Surgery  Bilateral. multiple Oral Surgery   Diagnostic Studies History (Tanisha A. Owens Shark, [place]; 10/18/2017 11:42 AM) Colonoscopy  never  Allergies (Tanisha A. Owens Shark, [place]; 10/18/2017 11:43 AM) No Known Drug Allergies [10/18/2017]: Allergies Reconciled   Medication History (Tanisha A. Owens Shark, [place]; 10/18/2017 11:43 AM) No Current Medications Medications Reconciled  Social History (Tanisha A. Owens Shark, [place]; 10/18/2017 11:42 AM) Alcohol use  Occasional alcohol use. Caffeine use  Coffee. Illicit drug use  Prefer to discuss with provider. Tobacco use  Current every day smoker.  Family History (Tanisha A. Owens Shark, [place]; 10/18/2017 11:42 AM) Heart Disease  Father.  Other Problems (Tanisha A. Owens Shark, [place]; 10/18/2017 11:42 AM) Inguinal Hernia     Review of Systems Ralene Ok MD; 10/18/2017 11:50 AM) General Not Present- Appetite Loss, Chills, Fatigue, Fever, Night Sweats, Weight Gain and Weight Loss. HEENT Not Present- Earache, Hearing Loss, Hoarseness, Nose Bleed, Oral Ulcers, Ringing in the Ears, Seasonal Allergies, Sinus Pain, Sore Throat, Visual Disturbances, Wears glasses/contact lenses and Yellow Eyes. Respiratory Not Present- Bloody sputum, Chronic Cough, Difficulty Breathing, Snoring and Wheezing. Breast Not Present- Breast Mass, Breast Pain, Nipple Discharge and Skin Changes. Cardiovascular Not Present- Chest Pain, Difficulty Breathing  Lying Down, Leg Cramps, Palpitations, Rapid Heart Rate, Shortness of Breath and Swelling of Extremities. Gastrointestinal Not Present- Abdominal Pain, Bloating, Bloody Stool, Change in Bowel Habits, Chronic diarrhea, Constipation, Difficulty Swallowing, Excessive gas, Gets full quickly at meals, Hemorrhoids, Indigestion, Nausea, Rectal Pain and Vomiting. Male Genitourinary Not Present- Blood in Urine, Change in Urinary Stream, Frequency, Impotence, Nocturia, Painful Urination, Urgency and Urine Leakage. Musculoskeletal Not Present- Back Pain, Joint Pain, Joint Stiffness, Muscle Pain, Muscle Weakness and Swelling of Extremities. Neurological Not Present- Decreased Memory, Fainting, Headaches, Numbness, Seizures, Tingling, Tremor, Trouble walking and Weakness. Psychiatric Not Present- Anxiety, Bipolar, Change in Sleep Pattern, Depression, Fearful and Frequent crying. Endocrine Not Present- Cold Intolerance, Excessive Hunger, Hair Changes, Heat Intolerance, Hot flashes and New Diabetes. Hematology Not Present- Blood Thinners, Easy Bruising, Excessive bleeding, Gland problems, HIV and Persistent Infections. All other systems negative  Vitals (Tanisha A. Brown RMA; 10/18/2017 11:43 AM) 10/18/2017 11:42 AM Weight: 220.8 lb Height: 74in Body Surface Area: 2.27 m Body Mass Index: 28.35 kg/m  Temp.: 98.69F  Pulse: 83 (Regular)  BP: 116/76 (Sitting, Left Arm, Standard)       Physical Exam Ralene Ok MD; 10/18/2017 11:51 AM) The physical exam findings are as follows: Note:Constitutional: No acute distress, conversant, appears stated age  Eyes: Anicteric sclerae, moist conjunctiva, no lid lag  Neck: No thyromegaly, trachea midline, no cervical lymphadenopathy  Lungs: Clear to auscultation biilaterally, normal respiratory effot  Cardiovascular: regular rate & rhythm, no murmurs, no peripheal edema, pedal pulses 2+  GI: Soft, no masses or hepatosplenomegaly, non-tender to  palpation  MSK: Normal gait, no clubbing cyanosis, edema  Skin: No rashes, palpation reveals normal skin turgor, left trapezius superficial skin cyst, 4 x 5 cm, subcutaneous  Psychiatric: Appropriate judgment and insight, oriented to person, place, and time    Assessment & Plan Ralene Ok MD; 10/18/2017 11:51 AM) CYST OF SKIN (L72.9) Impression: 67 year old male with a left trapezius skin cyst measured possible 4 x 5 cm  1. The patient like to proceed to the operating room for excision the skin cyst 2. Discussion of the risks and benefits of the procedure to include but not limited to: Infection, bleeding, damage to structures, possible recurrence. The patient was understanding and wishes to proceed.

## 2017-10-30 ENCOUNTER — Other Ambulatory Visit: Payer: Self-pay | Admitting: General Surgery

## 2017-10-30 DIAGNOSIS — L720 Epidermal cyst: Secondary | ICD-10-CM | POA: Diagnosis not present

## 2017-10-30 DIAGNOSIS — L723 Sebaceous cyst: Secondary | ICD-10-CM | POA: Diagnosis not present

## 2017-11-23 NOTE — Progress Notes (Signed)
PCP notes:  Health maintenance: Tdap: Updated patient regarding insurance coverage.  Colonoscopy: Was scheduled for tomorrow but he canclled due to plans.  PCV13: Patient states that he does not usually like vaccines, I explained the reasoning for the pneumonia vaccines and gave him the VIS for him to review.   Abnormal screenings: None.   Patient concerns: None.    Nurse concerns: None.   Next PCP appt: 03/25/18

## 2017-11-23 NOTE — Progress Notes (Addendum)
Subjective:   Keith Curtis is a 67 y.o. male who presents for an Initial Medicare Annual Wellness Visit.  Lives in one story home, still works full time at contracting job.   Review of Systems  No ROS.  Medicare Wellness Visit. Additional risk factors are reflected in the social history.  Sleeps 7-8 hrs/night. Gets up during the night to use restroom once.  Cardiac Risk Factors include: none;advanced age (>55men, >53 women);male gender    Objective:    Today's Vitals   11/27/17 1445  BP: 100/80  Pulse: 61  Resp: 16  SpO2: 98%  Weight: 221 lb 9.6 oz (100.5 kg)  Height: 6\' 2"  (1.88 m)   Body mass index is 28.45 kg/m.  Advanced Directives 11/27/2017  Does Patient Have a Medical Advance Directive? No  Would patient like information on creating a medical advance directive? Yes (MAU/Ambulatory/Procedural Areas - Information given)  Patient states that he does not have any advanced directives but is interested in filling them out. I thoroughly explained the Advanced Directive packet with patient and answered all questions. This was a face to face conversation with the patient and a total time of 16 minutes was spent. Verbal order was received to discuss advanced directives from Dr Yong Channel.  Current Medications (verified) Outpatient Encounter Medications as of 11/27/2017  Medication Sig  . sildenafil (VIAGRA) 50 MG tablet Take 50 mg by mouth daily as needed for erectile dysfunction.   No facility-administered encounter medications on file as of 11/27/2017.     Allergies (verified) Patient has no known allergies.   History: Past Medical History:  Diagnosis Date  . Erectile dysfunction    buys sildenafil from [place] or other- 40 or 60mg   . Genital herpes    last outbreak 2012 or before  . 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 Tobacco abuse    1.5 PPD. uses as a crutch. not ready to quit   Past Surgical History:  Procedure Laterality Date  . APPENDECTOMY  1975  .  CYST EXCISION     left shoulder blade  . hernia bilateral     inguinal- both sides- 3 surgeries total. around 2009 last surgery.    Family History  Problem Relation Age of Onset  . Other Mother        died in 72s- unknown cause  . Heart attack Father        died at age 56  . Stroke Father        21   Social History   Socioeconomic History  . Marital status: Divorced    Spouse name: Not on file  . Number of children: Not on file  . Years of education: Not on file  . Highest education level: Not on file  Occupational History  . Not on file  Social Needs  . Financial resource strain: Not on file  . Food insecurity:    Worry: Not on file    Inability: Not on file  . Transportation needs:    Medical: Not on file    Non-medical: Not on file  Tobacco Use  . Smoking status: Current Every Day Smoker    Packs/day: 1.50    Years: 30.00    Pack years: 45.00    Types: Cigarettes  . Smokeless tobacco: Never Used  Substance and Sexual Activity  . Alcohol use: Yes    Alcohol/week: 2.4 - 3.0 oz    Types: 4 - 5 Standard drinks or  equivalent per week  . Drug use: Not on file    Comment: marijuana  . Sexual activity: Yes    Comment: GF already has genital herpes  Lifestyle  . Physical activity:    Days per week: Not on file    Minutes per session: Not on file  . Stress: Not on file  Relationships  . Social connections:    Talks on phone: Not on file    Gets together: Not on file    Attends religious service: Not on file    Active member of club or organization: Not on file    Attends meetings of clubs or organizations: Not on file    Relationship status: Not on file  Other Topics Concern  . Not on file  Social History Narrative   Divorced. GF 10 years in 2018. Son from prior marriage. No grandkids.       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       HObbies: time with gf, playing guitar, [place]- [place] with GF. Enjoys hiking.    Tobacco Counseling Ready to quit: No Counseling given: Yes  Activities of Daily Living In your present state of health, do you have any difficulty performing the following activities: 11/27/2017  Hearing? N  Vision? N  Difficulty concentrating or making decisions? N  Walking or climbing stairs? N  Dressing or bathing? N  Doing errands, shopping? N  Preparing Food and eating ? N  Using the Toilet? N  In the past six months, have you accidently leaked urine? N  Do you have problems with loss of bowel control? N  Managing your Medications? N  Managing your Finances? N  Housekeeping or managing your Housekeeping? N  Some recent data might be hidden     Immunizations and Health Maintenance  There is no immunization history on file for this patient. Health Maintenance Due  Topic Date Due  . COLONOSCOPY  07/01/2000  . PNA vac Low Risk Adult (1 of 2 - PCV13) 07/02/2015    Patient Care Team: Marin Olp, MD as PCP - General (Family Medicine)  Indicate any recent Medical Services you may have received from other than [facility] providers in the past year (date may be approximate).    Assessment:   This is a routine wellness examination for [place].  Hearing/Vision screen No exam data present  Dietary issues and exercise activities discussed: Current Exercise Habits: The patient does not participate in regular exercise at present(Patient states he has an active job and does a lot of walking and climbing), Exercise limited by: None identified  Eats 2 meals/day. Muffins, donuts and coffee for breakfast.  Or Berniece Salines and eggs. Afternoon - pizza, cereal. Meals vary. Occasional cooking at home. Drinks 3-4 glasses water/day. Drinks a few glasses of milk/day.  Goals    . Complete colonoscopy prior to August      Depression Screen PHQ 2/9 Scores 11/27/2017 09/25/2017 09/01/2016 11/10/2015  PHQ - 2 Score 0 0 0 0  PHQ- 9 Score 0 - - -    Fall Risk Fall Risk   11/27/2017 09/25/2017 09/01/2016  Falls in the past year? No No No   Cognitive Function: Ad8 score reviewed for issues:  Issues making decisions: no  Less interest in hobbies / activities:no  Repeats questions, stories (family complaining):no  Trouble using ordinary gadgets (microwave, computer, phone):no  Forgets the month or year: no  Mismanaging finances: no  Remembering appts:no  Daily problems with thinking and/or memory:no Ad8 score is=0 Patient is still working full time. Patient also reads and does all of the puzzles in the papers daily. Also plays guitar.          Screening Tests Health Maintenance  Topic Date Due  . COLONOSCOPY  07/01/2000  . PNA vac Low Risk Adult (1 of 2 - PCV13) 07/02/2015  . INFLUENZA VACCINE  05/28/2018 (Originally 03/14/2018)  . TETANUS/TDAP  11/28/2018 (Originally 07/01/1969)  . Hepatitis C Screening  Completed      Plan:   Follow up with PCP as directed.  I have personally reviewed and noted the following in the patient's chart:   . Medical and social history . Use of alcohol, tobacco or illicit drugs  . Current medications and supplements . Functional ability and status . Nutritional status . Physical activity . Advanced directives . List of other physicians . Vitals . Screenings to include cognitive, depression, and falls . Referrals and appointments  In addition, I have reviewed and discussed with patient certain preventive protocols, quality metrics, and best practice recommendations. A written personalized care plan for preventive services as well as general preventive health recommendations were provided to patient.     Williemae Area, RN   11/27/2017

## 2017-11-27 ENCOUNTER — Encounter: Payer: Self-pay | Admitting: *Deleted

## 2017-11-27 ENCOUNTER — Ambulatory Visit (INDEPENDENT_AMBULATORY_CARE_PROVIDER_SITE_OTHER): Payer: PPO | Admitting: *Deleted

## 2017-11-27 VITALS — BP 100/80 | HR 61 | Resp 16 | Ht 74.0 in | Wt 221.6 lb

## 2017-11-27 DIAGNOSIS — Z0000 Encounter for general adult medical examination without abnormal findings: Secondary | ICD-10-CM

## 2017-11-27 NOTE — Patient Instructions (Addendum)
Keith Curtis , Thank you for taking time to come for your Medicare Wellness Visit. I appreciate your ongoing commitment to your health goals. Please review the following plan we discussed and let me know if I can assist you in the future.   These are the goals we discussed: Goals    . Complete colonoscopy prior to August       This is a list of the screening recommended for you and due dates:  Health Maintenance  Topic Date Due  . Colon Cancer Screening  07/01/2000  . Pneumonia vaccines (1 of 2 - PCV13) 07/02/2015  . Flu Shot  05/28/2018*  . Tetanus Vaccine  11/28/2018*  .  Hepatitis C: One time screening is recommended by Center for Disease Control  (CDC) for  adults born from [birth year] through 1966.   Completed  *Topic was postponed. The date shown is not the original due date.   Preventive Care for Adults  A healthy lifestyle and preventive care can promote health and wellness. Preventive health guidelines for adults include the following key practices.  . A routine yearly physical is a good way to check with your health care provider about your health and preventive screening. It is a chance to share any concerns and updates on your health and to receive a thorough exam.  . Visit your dentist for a routine exam and preventive care every 6 months. Brush your teeth twice a day and floss once a day. Good oral hygiene prevents tooth decay and gum disease.  . The frequency of eye exams is based on your age, health, family medical history, use  of contact lenses, and other factors. Follow your health care provider's recommendations for frequency of eye exams.  . Eat a healthy diet. Foods like vegetables, fruits, whole grains, low-fat dairy products, and lean protein foods contain the nutrients you need without too many calories. Decrease your intake of foods high in solid fats, added sugars, and salt. Eat the right amount of calories for you. Get information about a proper diet from your health  care provider, if necessary.  . Regular physical exercise is one of the most important things you can do for your health. Most adults should get at least 150 minutes of moderate-intensity exercise (any activity that increases your heart rate and causes you to sweat) each week. In addition, most adults need muscle-strengthening exercises on 2 or more days a week.  Silver Sneakers may be a benefit available to you. To determine eligibility, you may visit the website: www.silversneakers.com or contact program at 807-194-2668 Mon-Fri between 8AM-8PM.   . Maintain a healthy weight. The body mass index (BMI) is a screening tool to identify possible weight problems. It provides an estimate of body fat based on height and weight. Your health care provider can find your BMI and can help you achieve or maintain a healthy weight.   For adults 20 years and older: ? A BMI below 18.5 is considered underweight. ? A BMI of 18.5 to 24.9 is normal. ? A BMI of 25 to 29.9 is considered overweight. ? A BMI of 30 and above is considered obese.   . Maintain normal blood lipids and cholesterol levels by exercising and minimizing your intake of saturated fat. Eat a balanced diet with plenty of fruit and vegetables. Blood tests for lipids and cholesterol should begin at age 67 and be repeated every 5 years. If your lipid or cholesterol levels are high, you are over 50, or you  are at high risk for heart disease, you may need your cholesterol levels checked more frequently. Ongoing high lipid and cholesterol levels should be treated with medicines if diet and exercise are not working.  . If you smoke, find out from your health care provider how to quit. If you do not use tobacco, please do not start.  . If you choose to drink alcohol, please do not consume more than 2 drinks per day. One drink is considered to be 12 ounces (355 mL) of beer, 5 ounces (148 mL) of wine, or 1.5 ounces (44 mL) of liquor.  . If you are 74-51  years old, ask your health care provider if you should take aspirin to prevent strokes.  . Use sunscreen. Apply sunscreen liberally and repeatedly throughout the day. You should seek shade when your shadow is shorter than you. Protect yourself by wearing long sleeves, pants, a wide-brimmed hat, and sunglasses year round, whenever you are outdoors.  . Once a month, do a whole body skin exam, using a mirror to look at the skin on your back. Tell your health care provider of new moles, moles that have irregular borders, moles that are larger than a pencil eraser, or moles that have changed in shape or color.

## 2017-11-27 NOTE — Progress Notes (Signed)
I have reviewed and agree with note, evaluation, plan. Hopeful he reschedules colonoscopy.   Garret Reddish, MD

## 2017-11-28 ENCOUNTER — Encounter: Payer: PPO | Admitting: Internal Medicine

## 2017-12-12 ENCOUNTER — Encounter: Payer: Self-pay | Admitting: Family Medicine

## 2018-03-25 ENCOUNTER — Encounter: Payer: Self-pay | Admitting: Family Medicine

## 2018-03-25 ENCOUNTER — Ambulatory Visit (INDEPENDENT_AMBULATORY_CARE_PROVIDER_SITE_OTHER): Payer: PPO | Admitting: Family Medicine

## 2018-03-25 VITALS — BP 100/78 | HR 87 | Temp 98.2°F | Ht 74.0 in | Wt 208.8 lb

## 2018-03-25 DIAGNOSIS — F17200 Nicotine dependence, unspecified, uncomplicated: Secondary | ICD-10-CM | POA: Diagnosis not present

## 2018-03-25 DIAGNOSIS — E781 Pure hyperglyceridemia: Secondary | ICD-10-CM

## 2018-03-25 NOTE — Assessment & Plan Note (Addendum)
S: very mild hypertriglyceridemia last year but not at levels to consider treatment (over 200 even with lifestyle changes)  Lab Results  Component Value Date   CHOL 161 09/25/2017   HDL 52.70 09/25/2017   LDLCALC 75 09/25/2017   TRIG 167.0 (H) 09/25/2017   CHOLHDL 3 09/25/2017   A/P: encouraged risk factor modification- should pursue healthy lifestyle with improved diet- he is already very active with his work. Quitting smoking would reduce cardiac risk as well

## 2018-03-25 NOTE — Progress Notes (Signed)
Subjective:  Keith Curtis is a 67 y.o. year old very pleasant male patient who presents for/with See problem oriented charting ROS- No chest pain or shortness of breath. No headache or blurry vision.    Past Medical History-  Patient Active Problem List   Diagnosis Date Noted  . Tobacco dependence     Priority: High  . Hypertriglyceridemia 03/25/2018    Priority: Medium  . Lipoma of left shoulder 09/01/2016    Priority: Medium  . Marijuana use     Priority: Medium  . Knee osteoarthritis 09/01/2016    Priority: Low  . Genital herpes     Priority: Low  . Erectile dysfunction     Priority: Low    Medications- reviewed and updated Current Outpatient Medications  Medication Sig Dispense Refill  . sildenafil (VIAGRA) 50 MG tablet Take 50 mg by mouth daily as needed for erectile dysfunction.     No current facility-administered medications for this visit.     Objective: BP 100/78 (BP Location: Left Arm, Patient Position: Sitting, Cuff Size: Normal)   Pulse 87   Temp 98.2 F (36.8 C) (Oral)   Ht 6\' 2"  (1.88 m)   Wt 208 lb 12.8 oz (94.7 kg)   SpO2 92%   BMI 26.81 kg/m  Gen: NAD, resting comfortably CV: RRR no murmurs rubs or gallops Lungs: CTAB no crackles, wheeze, rhonchi Abdomen: soft/nontender/nondistended/normal bowel sounds. Ext: no edema Skin: warm, dry  Assessment/Plan:  Other notes: 1.Health Maintenance Due Topic Date Due  . COLONOSCOPY - Patient will schedule another appointment. Patient had to cancel his last appointment. 07/01/2000  . PNA vac Low Risk Adult (1 of 2 - PCV13) - Patient Declined 07/02/2015  2. Intermittent right neck tension for close to a year now. He states it is much improved- hurts more intermittently. Suspect some underlying cervical arthritis 3. last year we referred him to general surgery for lipoma up to 5.5 x5.5 cm from 5 x 4 cm. Dr. Rosendo Gros did the surgery . He is pleased with result 4. He states strength in his leg is better.  Continues to have some knee pain with known OA  Tobacco dependence S: 1.5 PPD. Patient is not ready to quit A/P:  Stressed importance of quitting- he is not ready. Advised reasoning for lung cancer screening- declines lung cancer screening program. He will do AAA scan after counseling  Hypertriglyceridemia S: very mild hypertriglyceridemia last year but not at levels to consider treatment (over 200 even with lifestyle changes)  Lab Results  Component Value Date   CHOL 161 09/25/2017   HDL 52.70 09/25/2017   LDLCALC 75 09/25/2017   TRIG 167.0 (H) 09/25/2017   CHOLHDL 3 09/25/2017   A/P: encouraged risk factor modification- should pursue healthy lifestyle with improved diet- he is already very active with his work. Quitting smoking would reduce cardiac risk as well   Future Appointments  Date Time Provider [place]  12/03/2018  2:00 PM [facility]ALTH COACH [facility]  12/03/2018  3:30 PM Marin Olp, MD [facility]   Lab/Order associations: Tobacco dependence - Plan: VAS US AORTA MEDICARE SCREEN  Hypertriglyceridemia  Time Stamp The duration of face-to-face time during this visit was greater than 15 minutes. Greater than 50% of this time was spent in counseling, explanation of diagnosis, planning of further management, and/or coordination of care including discussion of need to quit smoking and reasons, reasons for AAA screen, reasons for lung cancer screening    Return precautions advised.  Garret Reddish, MD

## 2018-03-25 NOTE — Patient Instructions (Addendum)
Health Maintenance Due  Topic Date Due  . COLONOSCOPY - Patient will schedule another appointment. Patient had to cancel his last appointment. 07/01/2000  . PNA vac Low Risk Adult (1 of 2 - PCV13) - Patient Declined 07/02/2015   . Please check with your pharmacy to see if they have the shingrix vaccine. If they do- please get this immunization and update [place] by phone call or mychart with dates you receive the vaccine.   . Strongly encourage smoking cessation    We will call you within two weeks about your referral for ultrasound of the aorta. If you do not hear within 3 weeks, give [place] a call.   Schedule a visit with me on the same day you have your annual wellness visit in April and we will check in then

## 2018-03-25 NOTE — Assessment & Plan Note (Signed)
S: 1.5 PPD. Patient is not ready to quit A/P:  Stressed importance of quitting- he is not ready. Advised reasoning for lung cancer screening- declines lung cancer screening program. He will do AAA scan after counseling

## 2018-04-04 ENCOUNTER — Encounter (HOSPITAL_COMMUNITY): Payer: Self-pay

## 2018-04-04 ENCOUNTER — Ambulatory Visit (HOSPITAL_COMMUNITY)
Admission: RE | Admit: 2018-04-04 | Payer: PPO | Source: Ambulatory Visit | Attending: Family Medicine | Admitting: Family Medicine

## 2018-10-14 ENCOUNTER — Ambulatory Visit (INDEPENDENT_AMBULATORY_CARE_PROVIDER_SITE_OTHER): Payer: PPO | Admitting: Sports Medicine

## 2018-10-14 ENCOUNTER — Encounter: Payer: Self-pay | Admitting: Sports Medicine

## 2018-10-14 ENCOUNTER — Ambulatory Visit (INDEPENDENT_AMBULATORY_CARE_PROVIDER_SITE_OTHER): Payer: PPO

## 2018-10-14 VITALS — BP 102/60 | HR 86 | Ht 74.0 in | Wt 206.6 lb

## 2018-10-14 DIAGNOSIS — M25512 Pain in left shoulder: Secondary | ICD-10-CM | POA: Diagnosis not present

## 2018-10-14 DIAGNOSIS — S4992XA Unspecified injury of left shoulder and upper arm, initial encounter: Secondary | ICD-10-CM | POA: Diagnosis not present

## 2018-10-14 DIAGNOSIS — S43102A Unspecified dislocation of left acromioclavicular joint, initial encounter: Secondary | ICD-10-CM | POA: Diagnosis not present

## 2018-10-14 DIAGNOSIS — M19012 Primary osteoarthritis, left shoulder: Secondary | ICD-10-CM

## 2018-10-14 DIAGNOSIS — R55 Syncope and collapse: Secondary | ICD-10-CM | POA: Diagnosis not present

## 2018-10-14 NOTE — Progress Notes (Signed)
Keith Curtis. , [place] [facility] [place]  Keith Curtis - 68 y.o. male MRN [id number]  Date of birth: 04/18/1951  Visit Date: October 14, 2018  PCP: Marin Olp, MD   Referred by: Marin Olp, MD  SUBJECTIVE:  Chief Complaint  Patient presents with  . Initial Assessment    L shoulder pain    HPI: Patient presents with left shoulder pain after a fall last night.  He reports having a syncopal episode after coitus.  He was in the kitchen reaching up to the top shelf and became lightheaded.  He did fall onto his left side.  No loss of consciousness as his girlfriend was present he did have a recurrent syncopal episode immediately after when he stood back up.  He has having pain over the lateral clavicle and superior shoulder.  Pain is worse with overhead range of motion.  He has pain with active range of motion in all planes.  Pain is reported as mild.  He has not tried taking any anti-inflammatories.  He does report taking sildenafil prior to this episode.  REVIEW OF SYSTEMS: No significant nighttime awakenings due to this issue. He does report intermittent night sweats over the past several years that he would like to talk to his PCP about.  He does have sweatiness around his chest and his neck but no true drenching night sweats.  He does have chronic dyspnea.  He is a long-term smoker.  HISTORY:  Prior history reviewed and updated per electronic medical record.  Patient Active Problem List   Diagnosis Date Noted  . Hypertriglyceridemia 03/25/2018  . Knee osteoarthritis 09/01/2016    Injection on 09/11/2016 of bilateral knees.   . Lipoma of left shoulder 09/01/2016    5X4 cm lipoma 2018. Increased in size 2019   . Tobacco dependence     1.5 PPD. uses as a crutch. not ready to quit. Declines lung cancer screening program. Will need AAA screen offered in future.    . Marijuana use     almost on daily basis   .  Genital herpes     last outbreak 2012 or before   . Erectile dysfunction     buys sildenafil from [place] or other- 40 or 60mg     Social History   Occupational History  . Not on file  Tobacco Use  . Smoking status: Current Every Day Smoker    Packs/day: 1.50    Years: 30.00    Pack years: 45.00    Types: Cigarettes  . Smokeless tobacco: Never Used  Substance and Sexual Activity  . Alcohol use: Yes    Alcohol/week: 4.0 - 5.0 standard drinks    Types: 4 - 5 Standard drinks or equivalent per week  . Drug use: Not on file    Comment: marijuana  . Sexual activity: Yes    Comment: GF already has genital herpes   Social History   Social History Narrative   Divorced. GF 10 years in 2018. Son from prior marriage. No grandkids.       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       HObbies: time with gf, playing guitar, [place]. Enjoys hiking.    Past Medical History:  Diagnosis Date  . Erectile dysfunction    buys sildenafil from [place] or other- 40 or 60mg   .  Genital herpes    last outbreak 2012 or before  . 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 Tobacco abuse    1.5 PPD. uses as a crutch. not ready to quit   Past Surgical History:  Procedure Laterality Date  . APPENDECTOMY  1975  . CYST EXCISION     left shoulder blade  . hernia bilateral     inguinal- both sides- 3 surgeries total. around 2009 last surgery.    family history includes Heart attack in his father; Other in his mother; Stroke in his father.  OBJECTIVE:  VS:  HT:6\' 2"  (188 cm)   WT:206 lb 9.6 oz (93.7 kg)  BMI:26.51    BP:102/60  HR:86bpm  TEMP: ( )  RESP:96 %   Orthostatic vital signs were reviewed per intake, normal   PHYSICAL EXAM: CONSTITUTIONAL: Well-developed, Well-nourished and In no acute distress EYES: Pupils are equal., EOM intact without nystagmus. and No scleral icterus. Psychiatric: Alert &  appropriately interactive. and Not depressed or anxious appearing. EXTREMITY EXAM: Warm and well perfused  Left shoulder is overall well aligned.  He does have a small deformity over the [facility] joint.  Small amount of pain over the coracoclavicular ligament.  No significant step-off of the clavicle.  He has mild amount of pain with axial load and circumduction of the shoulder joint.  Internal rotation, external rotation, empty can testing all within normal limits.  X-rays were reviewed with him today that do show fairly significant glenohumeral arthritis.  There is additional AC joint arthropathy.  No evidence of obvious clavicle fracture.   ASSESSMENT:  1. Acute pain of left shoulder   2. Acromioclavicular joint separation, left, initial encounter   3. Primary osteoarthritis of left shoulder   4. Syncope and collapse     PROCEDURES:  None  PLAN:  Pertinent additional documentation may be included in corresponding procedure notes, imaging studies, problem based documentation and patient instructions.  No problem-specific Assessment & Plan notes found for this encounter.   Symptoms are most consistent with a AC joint separation that is likely grade 1 to grade 2 with underlying osteoarthritis of both the [facility] joint and glenohumeral joint.  We discussed multiple options including rest, anti-inflammatories, icing.  Shoulder immobilizer was discussed but he would like to defer this at this time.  Avoidance of exacerbating activities discussed in detail.  He will plan to talk to Dr. Yong Channel tomorrow during his appointment with him regarding the syncopal episodes and the night sweats.  This very well may be related to the sildenafil use.  His orthostatics today are normal.  His blood pressure otherwise is markedly low which is change from in the past for him.  He has no chest pain, shortness of breath at this time.  Activity modifications and the importance of avoiding exacerbating activities (limiting  pain to no more than a 4 / 10 during or following activity) recommended and discussed.  Discussed red flag symptoms that warrant earlier emergent evaluation and patient voices understanding.   No orders of the defined types were placed in this encounter.  Lab Orders  No laboratory test(s) ordered today    Imaging Orders     DG Shoulder Left Referral Orders  No referral(s) requested today    Return in about 2 weeks (around 10/28/2018).  Repeat clinical exam and consideration of scapular stabilization exercises.         Gerda Diss, [place] [facility] Physician

## 2018-10-15 ENCOUNTER — Encounter: Payer: Self-pay | Admitting: Family Medicine

## 2018-10-15 ENCOUNTER — Ambulatory Visit (INDEPENDENT_AMBULATORY_CARE_PROVIDER_SITE_OTHER): Payer: PPO | Admitting: Family Medicine

## 2018-10-15 VITALS — BP 108/70 | HR 74 | Temp 97.4°F | Ht 74.0 in | Wt 208.4 lb

## 2018-10-15 DIAGNOSIS — R55 Syncope and collapse: Secondary | ICD-10-CM

## 2018-10-15 DIAGNOSIS — Z1211 Encounter for screening for malignant neoplasm of colon: Secondary | ICD-10-CM | POA: Diagnosis not present

## 2018-10-15 DIAGNOSIS — I8393 Asymptomatic varicose veins of bilateral lower extremities: Secondary | ICD-10-CM | POA: Diagnosis not present

## 2018-10-15 NOTE — Progress Notes (Signed)
Phone [phone]   Subjective:  DAELON DUNIVAN is a 68 y.o. year old very pleasant male patient who presents for/with See problem oriented charting ROS-no chest pain, shortness of breath, blurry vision.  Did have lightheadedness before syncopal episode.  Denies hitting head-fell down on shoulder.  Complains of some varicose veins. No facial or extremity weakness. No slurred words or trouble swallowing. no blurry vision or double vision. No paresthesias. No confusion or word finding difficulties.   Past Medical History-  Patient Active Problem List   Diagnosis Date Noted  . Tobacco dependence     Priority: High  . Hypertriglyceridemia 03/25/2018    Priority: Medium  . Lipoma of left shoulder 09/01/2016    Priority: Medium  . Marijuana use     Priority: Medium  . Knee osteoarthritis 09/01/2016    Priority: Low  . Genital herpes     Priority: Low  . Erectile dysfunction     Priority: Low  . Varicose veins of both lower extremities 10/15/2018    Medications- reviewed and updated Current Outpatient Medications  Medication Sig Dispense Refill  . sildenafil (VIAGRA) 50 MG tablet Take 50 mg by mouth daily as needed for erectile dysfunction.     No current facility-administered medications for this visit.      Objective:  BP 108/70 (BP Location: Left Arm, Patient Position: Sitting, Cuff Size: Large)   Pulse 74   Temp (!) 97.4 F (36.3 C) (Oral)   Ht 6\' 2"  (1.88 m)   Wt 208 lb 6.4 oz (94.5 kg)   SpO2 98%   BMI 26.76 kg/m  Gen: NAD, resting comfortably CV: RRR no murmurs rubs or gallops Lungs: CTAB no crackles, wheeze, rhonchi Abdomen: soft/nontender/nondistended/normal bowel sounds. No rebound or guarding.  Ext: no edema Skin: warm, dry Neuro: CN II-XII intact, sensation and reflexes normal throughout, 5/5 muscle strength in bilateral upper and lower extremities. Normal finger to nose. Normal rapid alternating movements. No pronator drift. Normal romberg. Normal gait.        Assessment and Plan   Vasovagal syncope S: night before last - states he was not eating well- then made love with Marcie Bal with him today- felt hungry and went to get some cookies. Felt faint and then reached up to get something out of cabinet- landed down on left shoulder. Lost consciousness. He states he stood up and then passed out again. Wife states no seizure like activity or fecal or urinary incontinence. He thinks he was simply overexhausted  Blood pressure was low normal- he has tried to eat better and stay better hydrated. He has had no recurrence of issues.   Saw Dr. Paulla Fore and had a grade 1 or grade 2 AC joint separation after this- he declined shoulder immobilizer  Has had some night sweats just the last 2 nights.  A/P: strongly suspect vasovagal syncope- patient had not been eating or drinking well then quickly got up to get something from the kitchen feeling lightheaded and when reached over his head had a syncopal episode- as soon as he got up he tried to stand quickly and had another episode. Since that time has been active but eating and drinking better and with no issues. Had also recently used sildenafil which could have increased his risks of syncope. Reassuring neurological exam.  We discussed potential work-up including EKG, echocardiogram, carotid Dopplers-but with risk of vasovagal syncope being so high we opted not to pursue work-up unless recurrence of symptoms. -Night sweats for 2 nights- we  discussed if this is a recurring issue we would consider further work-up.  We have a visit in April scheduled.  Patient wanted to update full blood work at that time  Varicose veins of both lower extremities S: Largely asymptomatic.present for years-girlfriend thinks slightly worse. No significant pain in daytime.  A/P: We discussed using compression stockings from [employer] in [place].  Patient agrees   We also spent time discussing the risks of smoking and encouraging cessation.  Also  counseled him on need for colonoscopy-seemed to be convinced.  Declined Prevnar and flu shot despite counseling  Future Appointments  Date Time Provider [place]  10/29/2018  2:00 PM Gerda Diss, DO [facility]  12/03/2018  3:40 PM Yong Channel Brayton Mars, MD [facility]  encouraged him to keep upcoming visit- advised not to come fasting that late in the day  Lab/Order associations: Screen for colon cancer - Plan: Ambulatory referral to Gastroenterology  Vasovagal syncope  Asymptomatic varicose veins of both lower extremities  Time Stamp The duration of face-to-face time during this visit was greater than 25 minutes. Greater than 50% of this time was spent in counseling, explanation of diagnosis, planning of further management, and/or coordination of care including discussion of proper hydration and eating, discussing high probability of vasovagal cause of syncope and potential of further work-up at this time-ultimately patient deciding on only working up further if recurrence with staying hydrated and eating regularly, cautioning on sildenafil's risks-would need to discontinue if recurs, discussing ER precautions- recurrence of syncope would be indication.     Return precautions advised.  Garret Reddish, MD

## 2018-10-15 NOTE — Assessment & Plan Note (Signed)
S: Largely asymptomatic.present for years-girlfriend thinks slightly worse. No significant pain in daytime.  A/P: We discussed using compression stockings from [employer] in [place].  Patient agrees

## 2018-10-15 NOTE — Patient Instructions (Addendum)
Health Maintenance Due  Topic Date Due  . COLONOSCOPY  - We will call you within two weeks about your referral for colonoscopy. If you do not hear within 3 weeks, give [place] a call.  07/01/2000  . PNA vac Low Risk Adult (1 of 2 - PCV13)- declined today 07/02/2015  . INFLUENZA VACCINE - declined 03/14/2018   As always- quitting smoking is one of the best things you can do for your health  Consider compression stockings- I believe they would be helpful to make sure legs dont get worse  You agreed if recurrence of passing out- to seek care immediately- would call 911 and we agreed if recurs despite staying hydrated and well fed we would consider more extensive workup

## 2018-10-29 ENCOUNTER — Ambulatory Visit (INDEPENDENT_AMBULATORY_CARE_PROVIDER_SITE_OTHER): Payer: PPO | Admitting: Sports Medicine

## 2018-10-29 ENCOUNTER — Encounter: Payer: Self-pay | Admitting: Sports Medicine

## 2018-10-29 ENCOUNTER — Other Ambulatory Visit: Payer: Self-pay

## 2018-10-29 VITALS — BP 110/82 | HR 70 | Ht 74.0 in | Wt 207.4 lb

## 2018-10-29 DIAGNOSIS — M25512 Pain in left shoulder: Secondary | ICD-10-CM

## 2018-10-29 DIAGNOSIS — S43102A Unspecified dislocation of left acromioclavicular joint, initial encounter: Secondary | ICD-10-CM | POA: Diagnosis not present

## 2018-10-29 NOTE — Progress Notes (Signed)
[name]. Xianna Siverling, [place]  MARVON SHILLINGBURG - 68 y.o. male MRN [id number]  Date of birth: [birth year]  Visit Date: October 29, 2018  PCP: Marin Olp, MD   Referred by: Marin Olp, MD  SUBJECTIVE:  Chief Complaint  Patient presents with  . Left Shoulder - Follow-up    XR L shoulder 10/14/2018. Pain at L [facility] joint.     HPI: Patient is here for follow-up of left shoulder pain.  He is 4 weeks out from syncopal episode.  Denies any recurrent syncopal or presyncopal symptoms.  Left shoulder pain is essentially resolved but he has occasional soreness especially with lifting.  He has no pain at night and is able to lay on the side without difficulty.  No mechanical symptoms.  He is not taking any anti-inflammatories at this time.  REVIEW OF SYSTEMS: No significant nighttime awakenings due to this issue. Denies fevers, chills, recent weight gain or weight loss.  No night sweats.  Pt denies any change in bowel or bladder habits, muscle weakness, numbness or falls associated with this pain.  HISTORY:  Prior history reviewed and updated per electronic medical record.  Patient Active Problem List   Diagnosis Date Noted  . Varicose veins of both lower extremities 10/15/2018  . Hypertriglyceridemia 03/25/2018  . Knee osteoarthritis 09/01/2016    Injection on 09/11/2016 of bilateral knees.   . Lipoma of left shoulder 09/01/2016    5X4 cm lipoma 2018. Increased in size 2019   . Tobacco dependence     1.5 PPD. uses as a crutch. not ready to quit. Declines lung cancer screening program. Will need AAA screen offered in future.    . Marijuana use     almost on daily basis   . Genital herpes     last outbreak 2012 or before   . Erectile dysfunction     buys sildenafil from [place] or other- 40 or 60mg     Social History   Occupational History  . Not on file  Tobacco Use  . Smoking status: Current Every Day  Smoker    Packs/day: 1.50    Years: 30.00    Pack years: 45.00    Types: Cigarettes  . Smokeless tobacco: Never Used  Substance and Sexual Activity  . Alcohol use: Yes    Alcohol/week: 4.0 - 5.0 standard drinks    Types: 4 - 5 Standard drinks or equivalent per week  . Drug use: Not on file    Comment: marijuana  . Sexual activity: Yes    Comment: GF already has genital herpes   Social History   Social History Narrative   Divorced. GF 10 years in 2018. Son from prior marriage. No grandkids.       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       HObbies: time with gf, playing guitar, time in [place] with GF. Enjoys hiking.      OBJECTIVE:  VS:  HT:6\' 2"  (188 cm)   WT:207 lb 6.4 oz (94.1 kg)  BMI:26.62    BP:110/82  HR:70bpm  TEMP: ( )  RESP:96 %   PHYSICAL EXAM: Adult male. No acute distress.  Alert and appropriate. Left shoulder: Overall well aligned without significant deformity.  He has only minimal pain with palpation directly over the [facility] joint but this is mild.  Full overhead  range of motion with good internal and external rotation strength.  Normal empty can testing.  No significant crepitation.   ASSESSMENT:   1. Acute pain of left shoulder   2. Acromioclavicular joint separation, left, initial encounter      PROCEDURES:      PLAN:  Pertinent additional documentation may be included in corresponding procedure notes, imaging studies, problem based documentation and patient instructions.  No problem-specific Assessment & Plan notes found for this encounter.  Symptoms are consistent with grade 1 to grade 2 AC joint separation.  He is doing quite well with that and we will plan to have him continue just with normal day-to-day activities.  If any recurrent symptoms he will follow-up we did discuss he can have some persistent soreness and discomfort for 4 to 6 months.      Activity  modifications and the importance of avoiding exacerbating activities (limiting pain to no more than a 4 / 10 during or following activity) recommended and discussed.   Discussed red flag symptoms that warrant earlier emergent evaluation and patient voices understanding.    No orders of the defined types were placed in this encounter. Lab Orders  No laboratory test(s) ordered today   Imaging Orders  No imaging studies ordered today   Referral Orders  No referral(s) requested today      Return if symptoms worsen or fail to improve.          Gerda Diss, [place] [facility] Physician

## 2018-12-03 ENCOUNTER — Ambulatory Visit (INDEPENDENT_AMBULATORY_CARE_PROVIDER_SITE_OTHER): Payer: PPO | Admitting: Family Medicine

## 2018-12-03 ENCOUNTER — Encounter: Payer: Self-pay | Admitting: Family Medicine

## 2018-12-03 ENCOUNTER — Ambulatory Visit: Payer: PPO

## 2018-12-03 DIAGNOSIS — R55 Syncope and collapse: Secondary | ICD-10-CM | POA: Diagnosis not present

## 2018-12-03 DIAGNOSIS — F17200 Nicotine dependence, unspecified, uncomplicated: Secondary | ICD-10-CM

## 2018-12-03 DIAGNOSIS — R310 Gross hematuria: Secondary | ICD-10-CM | POA: Diagnosis not present

## 2018-12-03 NOTE — Progress Notes (Signed)
Phone [phone]   Subjective:  Virtual visit via Video note. Chief complaint: Chief Complaint  Patient presents with  . Follow-up  . Vasovagal syncope  . Hyperlipidemia   This visit type was conducted due to national recommendations for restrictions regarding the COVID-19 Pandemic (e.g. social distancing).  This format is felt to be most appropriate for this patient at this time balancing risks to patient and risks to population by having him in for in person visit.  No physical exam was performed (except for noted visual exam or audio findings with Telehealth visits).    Our team/I connected with [name] on 12/03/18 at  3:40 PM EDT by a video enabled telemedicine application ([email or web address]) and verified that I am speaking with the correct person using two identifiers.  Location patient: Home/just off [place] Location provider: [facility], office Persons participating in the virtual visit:  patient  Our team/I discussed the limitations of evaluation and management by telemedicine and the availability of in person appointments. In light of current covid-19 pandemic, patient also understands that we are trying to protect them by minimizing in office contact if at all possible.  The patient expressed consent for telemedicine visit and agreed to proceed. Patient understands insurance will be billed.   ROS- no fever/chills. Mild smokers cough. No burning with peeing or frequent urination.    Past Medical History-  Patient Active Problem List   Diagnosis Date Noted  . Tobacco dependence     Priority: High  . Hypertriglyceridemia 03/25/2018    Priority: Medium  . Lipoma of left shoulder 09/01/2016    Priority: Medium  . Marijuana use     Priority: Medium  . Knee osteoarthritis 09/01/2016    Priority: Low  . Genital herpes     Priority: Low  . Erectile dysfunction     Priority: Low  . Varicose veins of both lower extremities 10/15/2018    Medications- reviewed and updated  Current Outpatient Medications  Medication Sig Dispense Refill  . sildenafil (VIAGRA) 50 MG tablet Take 50 mg by mouth daily as needed for erectile dysfunction.     No current facility-administered medications for this visit.      Objective:  Was out working today- didn't have access to get any vital signs.  Gen: NAD, resting comfortably Lungs: nonlabored, normal respiratory rate  Skin: appears dry, no obvious rash Normal speech     Assessment and Plan   # Syncope follow up S: Seen for potential vasovagal syncopal episode last month- at a time when he was not staying well hydrated and eating well then stood up quickly and reached overhead. Since that time- he is Staying hydrated/eating well - and has had no more vasovagal syncope episodes.  No more night sweats either that were briefly reported last visit.  A/P: Patient seems to be doing much better- he had declined EKG, echocardiogram, carotid duplex unless recurrence at last visit- given no recurrence now we opted to monitor only and further evaluate if recurs   # Gross hematuria S: Patient noted light amount of blood come out of his penis when he peed. Next day noted a few small spots of blood - perhaps one about dime sized.  Then noted a small clot of blood within 12 hours in is urine. Tip of penis felt somewhat sore when the blood clot came out. hasnt felt discomfort since then. monogamous with girlfriend- long term GF- 12 years and he is not concerned about infidelity. History of herpes but  has nto had sores.   He has not had any issues since that time- no recurrence of hematuria A/P: Patient  With 45 pack years of smoking and now with gross hematuria-referral placed to urology today for further evaluation- possible workup to include cystoscopy and CT abd/pelvis in consultation with radiologist. We discussed there could be some benign causes but wanted to be safe given smoking history.   Advised physical in 4-5 months  Lab/Order  associations: Gross hematuria - Plan: Ambulatory referral to Urology  Tobacco dependence - Plan: Ambulatory referral to Urology  Vasovagal syncope  Return precautions advised.  Garret Reddish, MD

## 2018-12-03 NOTE — Patient Instructions (Addendum)
Health Maintenance Due  Topic Date Due  . TETANUS/TDAP - will hold off until next office visit 07/01/1969  . PNA vac Low Risk Adult (1 of 2 - PCV13) - hold off until next office visit 07/02/2015   Video visit- refer to urology

## 2019-03-26 ENCOUNTER — Other Ambulatory Visit: Payer: Self-pay

## 2019-03-26 ENCOUNTER — Ambulatory Visit (INDEPENDENT_AMBULATORY_CARE_PROVIDER_SITE_OTHER): Payer: PPO

## 2019-03-26 DIAGNOSIS — Z0000 Encounter for general adult medical examination without abnormal findings: Secondary | ICD-10-CM

## 2019-03-26 NOTE — Patient Instructions (Signed)
Keith Curtis , Thank you for taking time to come for your Medicare Wellness Visit. I appreciate your ongoing commitment to your health goals. Please review the following plan we discussed and let me know if I can assist you in the future.   Screening recommendations/referrals: Colorectal Screening: recommended; Cologuard information provided   Vision and Dental Exams: Recommended annual ophthalmology exams for early detection of glaucoma and other disorders of the eye Recommended annual dental exams for proper oral hygiene  Vaccinations: Influenza vaccine:  recommended this fall either at PCP office or through your local pharmacy  Pneumococcal vaccine: recommended  Tdap vaccine: Please call your insurance company to determine your out of pocket expense. You may also receive this vaccine at your local pharmacy or Health Dept. Shingles vaccine: Please call your insurance company to determine your out of pocket expense for the Shingrix vaccine. You may receive this vaccine at your local pharmacy.  Goals:  Recommend to exercise for at least 150 minutes per week.  Recommend to continue efforts to reduce smoking habits until no longer smoking. Smoking Cessation literature is attached below.  Next appointment: Please schedule your Annual Wellness Visit with your Nurse Health Advisor in one year.  Preventive Care 65 Years and Older, Male Preventive care refers to lifestyle choices and visits with your health care provider that can promote health and wellness. What does preventive care include?  A yearly physical exam. This is also called an annual well check.  Dental exams once or twice a year.  Routine eye exams. Ask your health care provider how often you should have your eyes checked.  Personal lifestyle choices, including:  Daily care of your teeth and gums.  Regular physical activity.  Eating a healthy diet.  Avoiding tobacco and drug use.  Limiting alcohol use.  Practicing  safe sex.  Taking low doses of aspirin every day if recommended by your health care provider..  Taking vitamin and mineral supplements as recommended by your health care provider. What happens during an annual well check? The services and screenings done by your health care provider during your annual well check will depend on your age, overall health, lifestyle risk factors, and family history of disease. Counseling  Your health care provider may ask you questions about your:  Alcohol use.  Tobacco use.  Drug use.  Emotional well-being.  Home and relationship well-being.  Sexual activity.  Eating habits.  History of falls.  Memory and ability to understand (cognition).  Work and work Statistician. Screening  You may have the following tests or measurements:  Height, weight, and BMI.  Blood pressure.  Lipid and cholesterol levels. These may be checked every 5 years, or more frequently if you are over 70 years old.  Skin check.  Lung cancer screening. You may have this screening every year starting at age 64 if you have a 30-pack-year history of smoking and currently smoke or have quit within the past 15 years.  Fecal occult blood test (FOBT) of the stool. You may have this test every year starting at age 65.  Flexible sigmoidoscopy or colonoscopy. You may have a sigmoidoscopy every 5 years or a colonoscopy every 10 years starting at age 50.  Prostate cancer screening. Recommendations will vary depending on your family history and other risks.  Hepatitis C blood test.  Hepatitis B blood test.  Sexually transmitted disease (STD) testing.  Diabetes screening. This is done by checking your blood sugar (glucose) after you have not eaten for a  while (fasting). You may have this done every 1-3 years.  Abdominal aortic aneurysm (AAA) screening. You may need this if you are a current or former smoker.  Osteoporosis. You may be screened starting at age 71 if you are at  high risk. Talk with your health care provider about your test results, treatment options, and if necessary, the need for more tests. Vaccines  Your health care provider may recommend certain vaccines, such as:  Influenza vaccine. This is recommended every year.  Tetanus, diphtheria, and acellular pertussis (Tdap, Td) vaccine. You may need a Td booster every 10 years.  Zoster vaccine. You may need this after age 25.  Pneumococcal 13-valent conjugate (PCV13) vaccine. One dose is recommended after age 19.  Pneumococcal polysaccharide (PPSV23) vaccine. One dose is recommended after age 13. Talk to your health care provider about which screenings and vaccines you need and how often you need them. This information is not intended to replace advice given to you by your health care provider. Make sure you discuss any questions you have with your health care provider. Document Released: 08/27/2015 Document Revised: 04/19/2016 Document Reviewed: 06/01/2015 Elsevier Interactive Patient Education  2017 [employer] Prevention in the Home Falls can cause injuries. They can happen to people of all ages. There are many things you can do to make your home safe and to help prevent falls. What can I do on the outside of my home?  Regularly fix the edges of walkways and driveways and fix any cracks.  Remove anything that might make you trip as you walk through a door, such as a raised step or threshold.  Trim any bushes or trees on the path to your home.  Use bright outdoor lighting.  Clear any walking paths of anything that might make someone trip, such as rocks or tools.  Regularly check to see if handrails are loose or broken. Make sure that both sides of any steps have handrails.  Any raised decks and porches should have guardrails on the edges.  Have any leaves, snow, or ice cleared regularly.  Use sand or salt on walking paths during winter.  Clean up any spills in your garage  right away. This includes oil or grease spills. What can I do in the bathroom?  Use night lights.  Install grab bars by the toilet and in the tub and shower. Do not use towel bars as grab bars.  Use non-skid mats or decals in the tub or shower.  If you need to sit down in the shower, use a plastic, non-slip stool.  Keep the floor dry. Clean up any water that spills on the floor as soon as it happens.  Remove soap buildup in the tub or shower regularly.  Attach bath mats securely with double-sided non-slip rug tape.  Do not have throw rugs and other things on the floor that can make you trip. What can I do in the bedroom?  Use night lights.  Make sure that you have a light by your bed that is easy to reach.  Do not use any sheets or blankets that are too big for your bed. They should not hang down onto the floor.  Have a firm chair that has side arms. You can use this for support while you get dressed.  Do not have throw rugs and other things on the floor that can make you trip. What can I do in the kitchen?  Clean up any spills right away.  Avoid walking on wet floors.  Keep items that you use a lot in easy-to-reach places.  If you need to reach something above you, use a strong step stool that has a grab bar.  Keep electrical cords out of the way.  Do not use floor polish or wax that makes floors slippery. If you must use wax, use non-skid floor wax.  Do not have throw rugs and other things on the floor that can make you trip. What can I do with my stairs?  Do not leave any items on the stairs.  Make sure that there are handrails on both sides of the stairs and use them. Fix handrails that are broken or loose. Make sure that handrails are as long as the stairways.  Check any carpeting to make sure that it is firmly attached to the stairs. Fix any carpet that is loose or worn.  Avoid having throw rugs at the top or bottom of the stairs. If you do have throw rugs,  attach them to the floor with carpet tape.  Make sure that you have a light switch at the top of the stairs and the bottom of the stairs. If you do not have them, ask someone to add them for you. What else can I do to help prevent falls?  Wear shoes that:  Do not have high heels.  Have rubber bottoms.  Are comfortable and fit you well.  Are closed at the toe. Do not wear sandals.  If you use a stepladder:  Make sure that it is fully opened. Do not climb a closed stepladder.  Make sure that both sides of the stepladder are locked into place.  Ask someone to hold it for you, if possible.  Clearly mark and make sure that you can see:  Any grab bars or handrails.  First and last steps.  Where the edge of each step is.  Use tools that help you move around (mobility aids) if they are needed. These include:  Canes.  Walkers.  Scooters.  Crutches.  Turn on the lights when you go into a dark area. Replace any light bulbs as soon as they burn out.  Set up your furniture so you have a clear path. Avoid moving your furniture around.  If any of your floors are uneven, fix them.  If there are any pets around you, be aware of where they are.  Review your medicines with your doctor. Some medicines can make you feel dizzy. This can increase your chance of falling. Ask your doctor what other things that you can do to help prevent falls. This information is not intended to replace advice given to you by your health care provider. Make sure you discuss any questions you have with your health care provider. Document Released: 05/27/2009 Document Revised: 01/06/2016 Document Reviewed: 09/04/2014 Elsevier Interactive Patient Education  2017 [employer].

## 2019-03-26 NOTE — Progress Notes (Signed)
This visit is being conducted via phone call due to the COVID-19 pandemic. This patient has given me verbal consent via phone to conduct this visit, patient states they are participating from their home address. Some vital signs may be absent or patient reported.   Patient identification: identified by name, DOB, and current address.    Subjective:   Keith Curtis is a 68 y.o. male who presents for Medicare Annual/Subsequent preventive examination.  Review of Systems:       Objective:    Vitals: There were no vitals taken for this visit.  There is no height or weight on file to calculate BMI.  Advanced Directives 03/26/2019 11/27/2017  Does Patient Have a Medical Advance Directive? No No  Would patient like information on creating a medical advance directive? No - Patient declined Yes (MAU/Ambulatory/Procedural Areas - Information given)    Tobacco Social History   Tobacco Use  Smoking Status Current Every Day Smoker  . Packs/day: 1.50  . Years: 30.00  . Pack years: 45.00  . Types: Cigarettes  Smokeless Tobacco Never Used     Ready to quit: No Counseling given: Not Answered   Clinical Intake:  Pre-visit preparation completed: Yes  Pain : No/denies pain  Diabetes: No  How often do you need to have someone help you when you read instructions, pamphlets, or other written materials from your doctor or pharmacy?: 1 - Never  Interpreter Needed?: No  Information entered by :: Denman George LPN  Past Medical History:  Diagnosis Date  . Erectile dysfunction    buys sildenafil from [place] or other- 40 or 60mg   . Genital herpes    last outbreak 2012 or before  . 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 Tobacco abuse    1.5 PPD. uses as a crutch. not ready to quit   Past Surgical History:  Procedure Laterality Date  . APPENDECTOMY  1975  . CYST EXCISION     left shoulder blade  . hernia bilateral     inguinal- both sides- 3 surgeries total. around 2009 last  surgery.    Family History  Problem Relation Age of Onset  . Other Mother        died in 65s- unknown cause  . Heart attack Father        died at age 56  . Stroke Father        85   Social History   Socioeconomic History  . Marital status: Divorced    Spouse name: Not on file  . Number of children: Not on file  . Years of education: Not on file  . Highest education level: Not on file  Occupational History  . Not on file  Social Needs  . Financial resource strain: Not on file  . Food insecurity    Worry: Not on file    Inability: Not on file  . Transportation needs    Medical: Not on file    Non-medical: Not on file  Tobacco Use  . Smoking status: Current Every Day Smoker    Packs/day: 1.50    Years: 30.00    Pack years: 45.00    Types: Cigarettes  . Smokeless tobacco: Never Used  Substance and Sexual Activity  . Alcohol use: Yes    Alcohol/week: 4.0 - 5.0 standard drinks    Types: 4 - 5 Standard drinks or equivalent per week  . Drug use: Not on file    Comment:  marijuana  . Sexual activity: Yes    Comment: GF already has genital herpes  Lifestyle  . Physical activity    Days per week: Not on file    Minutes per session: Not on file  . Stress: Not on file  Relationships  . Social Herbalist on phone: Not on file    Gets together: Not on file    Attends religious service: Not on file    Active member of club or organization: Not on file    Attends meetings of clubs or organizations: Not on file    Relationship status: Not on file  Other Topics Concern  . Not on file  Social History Narrative   Divorced. GF 10 years in 2018. Son from prior marriage. No grandkids.       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       HObbies: time with gf, playing guitar, time in [place] with GF. Enjoys hiking.     Outpatient Encounter Medications as of 03/26/2019  Medication Sig  . sildenafil  (VIAGRA) 50 MG tablet Take 50 mg by mouth daily as needed for erectile dysfunction.   No facility-administered encounter medications on file as of 03/26/2019.     Activities of Daily Living In your present state of health, do you have any difficulty performing the following activities: 03/26/2019  Hearing? N  Vision? N  Difficulty concentrating or making decisions? N  Walking or climbing stairs? N  Dressing or bathing? N  Doing errands, shopping? N  Preparing Food and eating ? N  Using the Toilet? N  In the past six months, have you accidently leaked urine? N  Do you have problems with loss of bowel control? N  Managing your Medications? N  Managing your Finances? N  Housekeeping or managing your Housekeeping? N  Some recent data might be hidden    Patient Care Team: Marin Olp, MD as PCP - General (Family Medicine)   Assessment:   This is a routine wellness examination for [place].  Exercise Activities and Dietary recommendations Current Exercise Habits: The patient does not participate in regular exercise at present  Goals    . Complete colonoscopy prior to August       Fall Risk Fall Risk  03/26/2019 11/27/2017 09/25/2017 09/01/2016  Falls in the past year? 0 No No No  Number falls in past yr: 0 - - -  Injury with Fall? 0 - - -  Follow up Education provided - - -    Depression Screen PHQ 2/9 Scores 03/26/2019 11/27/2017 09/25/2017 09/01/2016  PHQ - 2 Score 0 0 0 0  PHQ- 9 Score - 0 - -     There is no immunization history on file for this patient.  Qualifies for Shingles Vaccine? Discussed and patient will check with pharmacy for coverage.  Patient education handout provided    Screening Tests Health Maintenance  Topic Date Due  . TETANUS/TDAP  07/01/1969  . PNA vac Low Risk Adult (1 of 2 - PCV13) 07/02/2015  . INFLUENZA VACCINE  03/15/2019  . COLONOSCOPY  10/15/2019 (Originally 07/01/2000)  . Hepatitis C Screening  Completed   Cancer Screenings: Lung:  Low Dose CT Chest recommended if Age 41-80 years, 30 pack-year currently smoking OR have quit w/in 15years. Patient does qualify. Patient declines  Colorectal: Cologuard information provided       Plan:  I have personally reviewed and addressed the Medicare Annual Wellness questionnaire and have noted the following in the patient's chart:  A. Medical and social history B. Use of alcohol, tobacco or illicit drugs  C. Current medications and supplements D. Functional ability and status E.  Nutritional status F.  Physical activity G. Advance directives H. List of other physicians I.  Hospitalizations, surgeries, and ER visits in previous 12 months J.  [place] such as hearing and vision if needed, cognitive and depression L. Referrals, records requested, and appointments- none   In addition, I have reviewed and discussed with patient certain preventive protocols, quality metrics, and best practice recommendations. A written personalized care plan for preventive services as well as general preventive health recommendations were provided to patient.   Signed,  Denman George, LPN  Nurse Health Advisor   Nurse Notes: none

## 2019-03-27 NOTE — Progress Notes (Signed)
I have reviewed and agree with note, evaluation, plan.   Berma Harts, MD  

## 2019-11-06 ENCOUNTER — Encounter: Payer: Self-pay | Admitting: Family Medicine

## 2019-11-06 ENCOUNTER — Other Ambulatory Visit: Payer: Self-pay

## 2019-11-06 ENCOUNTER — Ambulatory Visit (INDEPENDENT_AMBULATORY_CARE_PROVIDER_SITE_OTHER): Payer: PPO | Admitting: Family Medicine

## 2019-11-06 VITALS — BP 110/60 | HR 78 | Temp 97.4°F | Ht 74.0 in | Wt 207.0 lb

## 2019-11-06 DIAGNOSIS — L409 Psoriasis, unspecified: Secondary | ICD-10-CM

## 2019-11-06 MED ORDER — PREDNISONE 50 MG PO TABS: ORAL_TABLET | ORAL | 0 refills | Status: DC

## 2019-11-06 MED ORDER — TRIAMCINOLONE ACETONIDE 0.5 % EX OINT: 1.0000 "application " | TOPICAL_OINTMENT | Freq: Two times a day (BID) | CUTANEOUS | 0 refills | Status: DC

## 2019-11-06 NOTE — Patient Instructions (Signed)
It was very nice to see you today!  Please start the prednisone.  Use the triamcinolone as stated for very irritated and itchy areas.  Let me know or let Dr. Yong Channel know if your symptoms are not improving.  Take care, Dr Jerline Pain  Please try these tips to maintain a healthy lifestyle:   Eat at least 3 REAL meals and 1-2 snacks per day.  Aim for no more than 5 hours between eating.  If you eat breakfast, please do so within one hour of getting up.    Each meal should contain half fruits/vegetables, one quarter protein, and one quarter carbs (no bigger than a computer mouse)   Cut down on sweet beverages. This includes juice, soda, and sweet tea.     Drink at least 1 glass of water with each meal and aim for at least 8 glasses per day   Exercise at least 150 minutes every week.

## 2019-11-06 NOTE — Progress Notes (Signed)
   Keith Curtis is a 69 y.o. male who presents today for an office visit.  Assessment/Plan:  Chronic Problems Addressed Today: Psoriasis Rash today consistent with psoriasis flare.  Given widespread distribution of rash, will start prednisone burst.  Also start topical triamcinolone to use as needed for problematic areas.  Discussed reasons to return to care.  If continues to have recurrences may need referral to dermatology.    Subjective:  HPI:  Patient is been diagnosed with psoriasis in the past.  Thinks he is having a flareup.  Has not had any symptoms for the past several years but over the last 3 months he has noticed increasing pruritic, erythematous rash.  Initially located on bilateral lower extremities.  Has also progressed to involve bilateral arms and lower back.  No specific treatments tried.  No obvious aggravating or precipitating events.       Objective:  Physical Exam: BP 110/60   Pulse 78   Temp (!) 97.4 F (36.3 C)   Ht 6\' 2"  (1.88 m)   Wt 207 lb (93.9 kg)   SpO2 94%   BMI 26.58 kg/m   Gen: No acute distress, resting comfortably Skin: Several erythematous well-defined plaques with overlying scale on bilateral lower extremities, bilateral upper extremities, and lower back.     Algis Greenhouse. Jerline Pain, MD 11/06/2019 1:20 PM

## 2019-12-18 ENCOUNTER — Telehealth: Payer: Self-pay | Admitting: Family Medicine

## 2019-12-18 MED ORDER — TRIAMCINOLONE ACETONIDE 0.5 % EX OINT: 1.0000 "application " | TOPICAL_OINTMENT | Freq: Two times a day (BID) | CUTANEOUS | 0 refills | Status: DC

## 2019-12-18 NOTE — Telephone Encounter (Signed)
Yes thanks-usually after a week or 10 days of use I like for patients to take at least a week break

## 2019-12-18 NOTE — Telephone Encounter (Signed)
Patient was given at 11/06/2019 with Dr. Jerline Pain. Ok to fill?

## 2019-12-18 NOTE — Telephone Encounter (Signed)
MEDICATION: triamcinolone ointment (KENALOG) 0.5 %  PHARMACY: [id number] - [place], [address] AT [place] Phone:  [phone]  Fax:  [phone]       Comments:  Patient was seen by Dr. Jerline Pain and he said this has been helping a lot and wanted to see if it could be refilled patient is almost out.     **Let patient know to contact pharmacy at the end of the day to make sure medication is ready. **  ** Please notify patient to allow 48-72 hours to process**  **Encourage patient to contact the pharmacy for refills or they can request refills through [facility]**

## 2019-12-18 NOTE — Telephone Encounter (Signed)
Medication sent in. 

## 2020-03-23 ENCOUNTER — Telehealth: Payer: Self-pay | Admitting: Family Medicine

## 2020-03-23 NOTE — Telephone Encounter (Signed)
Left message for patient to call back and schedule Medicare Annual Wellness Visit (AWV) either virtually/audio only OR in office. Whatever the patients preference is.  Last AWV 03/26/19; please schedule 03/26/20 OR AFTER with [facility]-Nurse Health Advisor at [place].  This should be a 45 minute visit.  Cell phone number did not have vm setup

## 2020-03-26 ENCOUNTER — Telehealth: Payer: Self-pay | Admitting: Family Medicine

## 2020-03-26 NOTE — Telephone Encounter (Signed)
Patient is requesting an apt before October and wanted to know if I could a same day sooner than that

## 2020-03-27 NOTE — Telephone Encounter (Signed)
Do you know what app was for?

## 2020-04-01 ENCOUNTER — Ambulatory Visit (INDEPENDENT_AMBULATORY_CARE_PROVIDER_SITE_OTHER): Payer: PPO

## 2020-04-01 DIAGNOSIS — Z0000 Encounter for general adult medical examination without abnormal findings: Secondary | ICD-10-CM | POA: Diagnosis not present

## 2020-04-01 NOTE — Patient Instructions (Signed)
Keith Curtis , Thank you for taking time to come for your Medicare Wellness Visit. I appreciate your ongoing commitment to your health goals. Please review the following plan we discussed and let me know if I can assist you in the future.   Screening recommendations/referrals: Colonoscopy: Declined and discussed Recommended yearly ophthalmology/optometry visit for glaucoma screening and checkup Recommended yearly dental visit for hygiene and checkup  Vaccinations: Influenza vaccine: Declined and discussed Pneumococcal vaccine: Declined and discussed Tdap vaccine: Declined and discussed Shingles vaccine: Shingrix discussed. Please contact your pharmacy for coverage information.    Covid-19: Declined and discussed  Advanced directives: Advance directive discussed with you today. I have provided a copy for you to complete at home and have notarized. Once this is complete please bring a copy in to our office so we can scan it into your chart.  Conditions/risks identified: None at this time  Next appointment: Follow up in one year for your annual wellness visit.   Preventive Care 27 Years and Older, Male Preventive care refers to lifestyle choices and visits with your health care provider that can promote health and wellness. What does preventive care include?  A yearly physical exam. This is also called an annual well check.  Dental exams once or twice a year.  Routine eye exams. Ask your health care provider how often you should have your eyes checked.  Personal lifestyle choices, including:  Daily care of your teeth and gums.  Regular physical activity.  Eating a healthy diet.  Avoiding tobacco and drug use.  Limiting alcohol use.  Practicing safe sex.  Taking low doses of aspirin every day.  Taking vitamin and mineral supplements as recommended by your health care provider. What happens during an annual well check? The services and screenings done by your health care  provider during your annual well check will depend on your age, overall health, lifestyle risk factors, and family history of disease. Counseling  Your health care provider may ask you questions about your:  Alcohol use.  Tobacco use.  Drug use.  Emotional well-being.  Home and relationship well-being.  Sexual activity.  Eating habits.  History of falls.  Memory and ability to understand (cognition).  Work and work Statistician. Screening  You may have the following tests or measurements:  Height, weight, and BMI.  Blood pressure.  Lipid and cholesterol levels. These may be checked every 5 years, or more frequently if you are over 35 years old.  Skin check.  Lung cancer screening. You may have this screening every year starting at age 66 if you have a 30-pack-year history of smoking and currently smoke or have quit within the past 15 years.  Fecal occult blood test (FOBT) of the stool. You may have this test every year starting at age 74.  Flexible sigmoidoscopy or colonoscopy. You may have a sigmoidoscopy every 5 years or a colonoscopy every 10 years starting at age 36.  Prostate cancer screening. Recommendations will vary depending on your family history and other risks.  Hepatitis C blood test.  Hepatitis B blood test.  Sexually transmitted disease (STD) testing.  Diabetes screening. This is done by checking your blood sugar (glucose) after you have not eaten for a while (fasting). You may have this done every 1-3 years.  Abdominal aortic aneurysm (AAA) screening. You may need this if you are a current or former smoker.  Osteoporosis. You may be screened starting at age 12 if you are at high risk. Talk with your  health care provider about your test results, treatment options, and if necessary, the need for more tests. Vaccines  Your health care provider may recommend certain vaccines, such as:  Influenza vaccine. This is recommended every year.  Tetanus,  diphtheria, and acellular pertussis (Tdap, Td) vaccine. You may need a Td booster every 10 years.  Zoster vaccine. You may need this after age 67.  Pneumococcal 13-valent conjugate (PCV13) vaccine. One dose is recommended after age 86.  Pneumococcal polysaccharide (PPSV23) vaccine. One dose is recommended after age 54. Talk to your health care provider about which screenings and vaccines you need and how often you need them. This information is not intended to replace advice given to you by your health care provider. Make sure you discuss any questions you have with your health care provider. Document Released: 08/27/2015 Document Revised: 04/19/2016 Document Reviewed: 06/01/2015 Elsevier Interactive Patient Education  2017 [place] Prevention in the Home Falls can cause injuries. They can happen to people of all ages. There are many things you can do to make your home safe and to help prevent falls. What can I do on the outside of my home?  Regularly fix the edges of walkways and driveways and fix any cracks.  Remove anything that might make you trip as you walk through a door, such as a raised step or threshold.  Trim any bushes or trees on the path to your home.  Use bright outdoor lighting.  Clear any walking paths of anything that might make someone trip, such as rocks or tools.  Regularly check to see if handrails are loose or broken. Make sure that both sides of any steps have handrails.  Any raised decks and porches should have guardrails on the edges.  Have any leaves, snow, or ice cleared regularly.  Use sand or salt on walking paths during winter.  Clean up any spills in your garage right away. This includes oil or grease spills. What can I do in the bathroom?  Use night lights.  Install grab bars by the toilet and in the tub and shower. Do not use towel bars as grab bars.  Use non-skid mats or decals in the tub or shower.  If you need to sit down in  the shower, use a plastic, non-slip stool.  Keep the floor dry. Clean up any water that spills on the floor as soon as it happens.  Remove soap buildup in the tub or shower regularly.  Attach bath mats securely with double-sided non-slip rug tape.  Do not have throw rugs and other things on the floor that can make you trip. What can I do in the bedroom?  Use night lights.  Make sure that you have a light by your bed that is easy to reach.  Do not use any sheets or blankets that are too big for your bed. They should not hang down onto the floor.  Have a firm chair that has side arms. You can use this for support while you get dressed.  Do not have throw rugs and other things on the floor that can make you trip. What can I do in the kitchen?  Clean up any spills right away.  Avoid walking on wet floors.  Keep items that you use a lot in easy-to-reach places.  If you need to reach something above you, use a strong step stool that has a grab bar.  Keep electrical cords out of the way.  Do not use  floor polish or wax that makes floors slippery. If you must use wax, use non-skid floor wax.  Do not have throw rugs and other things on the floor that can make you trip. What can I do with my stairs?  Do not leave any items on the stairs.  Make sure that there are handrails on both sides of the stairs and use them. Fix handrails that are broken or loose. Make sure that handrails are as long as the stairways.  Check any carpeting to make sure that it is firmly attached to the stairs. Fix any carpet that is loose or worn.  Avoid having throw rugs at the top or bottom of the stairs. If you do have throw rugs, attach them to the floor with carpet tape.  Make sure that you have a light switch at the top of the stairs and the bottom of the stairs. If you do not have them, ask someone to add them for you. What else can I do to help prevent falls?  Wear shoes that:  Do not have high  heels.  Have rubber bottoms.  Are comfortable and fit you well.  Are closed at the toe. Do not wear sandals.  If you use a stepladder:  Make sure that it is fully opened. Do not climb a closed stepladder.  Make sure that both sides of the stepladder are locked into place.  Ask someone to hold it for you, if possible.  Clearly mark and make sure that you can see:  Any grab bars or handrails.  First and last steps.  Where the edge of each step is.  Use tools that help you move around (mobility aids) if they are needed. These include:  Canes.  Walkers.  Scooters.  Crutches.  Turn on the lights when you go into a dark area. Replace any light bulbs as soon as they burn out.  Set up your furniture so you have a clear path. Avoid moving your furniture around.  If any of your floors are uneven, fix them.  If there are any pets around you, be aware of where they are.  Review your medicines with your doctor. Some medicines can make you feel dizzy. This can increase your chance of falling. Ask your doctor what other things that you can do to help prevent falls. This information is not intended to replace advice given to you by your health care provider. Make sure you discuss any questions you have with your health care provider. Document Released: 05/27/2009 Document Revised: 01/06/2016 Document Reviewed: 09/04/2014 Elsevier Interactive Patient Education  2017 [employer].

## 2020-04-01 NOTE — Progress Notes (Signed)
Virtual Visit via Telephone Note  I connected with  Keith Curtis on 04/01/20 at  1:45 PM EDT by telephone and verified that I am speaking with the correct person using two identifiers.  Medicare Annual Wellness visit completed telephonically due to Covid-19 pandemic.   Persons participating in this call: This Health Coach and this patient.   Location: Patient: Home Provider: Office   I discussed the limitations, risks, security and privacy concerns of performing an evaluation and management service by telephone and the availability of in person appointments. The patient expressed understanding and agreed to proceed.  Unable to perform video visit due to video visit attempted and failed and/or patient does not have video capability.   Some vital signs may be absent or patient reported.   Willette Brace, LPN    Subjective:   Keith Curtis is a 69 y.o. male who presents for Medicare Annual/Subsequent preventive examination.  Review of Systems     Cardiac Risk Factors include: male gender;smoking/ tobacco exposure     Objective:    There were no vitals filed for this visit. There is no height or weight on file to calculate BMI.  Advanced Directives 04/01/2020 03/26/2019 11/27/2017  Does Patient Have a Medical Advance Directive? No No No  Would patient like information on creating a medical advance directive? Yes (MAU/Ambulatory/Procedural Areas - Information given) No - Patient declined Yes (MAU/Ambulatory/Procedural Areas - Information given)    Current Medications (verified) Outpatient Encounter Medications as of 04/01/2020  Medication Sig  . sildenafil (VIAGRA) 50 MG tablet Take 50 mg by mouth daily as needed for erectile dysfunction.  . triamcinolone ointment (KENALOG) 0.5 % Apply 1 application topically 2 (two) times daily.  . [DISCONTINUED] predniSONE (DELTASONE) 50 MG tablet Take 1 tablet daily for 5 days. Then take 1/2 tablet daily for 2 days. (Patient not taking:  Reported on 04/01/2020)   No facility-administered encounter medications on file as of 04/01/2020.    Allergies (verified) Patient has no known allergies.   History: Past Medical History:  Diagnosis Date  . Erectile dysfunction    buys sildenafil from [place] or other- 40 or 60mg   . Genital herpes    last outbreak 2012 or before  . 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 Tobacco abuse    1.5 PPD. uses as a crutch. not ready to quit   Past Surgical History:  Procedure Laterality Date  . APPENDECTOMY  1975  . CYST EXCISION     left shoulder blade  . hernia bilateral     inguinal- both sides- 3 surgeries total. around 2009 last surgery.    Family History  Problem Relation Age of Onset  . Other Mother        died in 70s- unknown cause  . Heart attack Father        died at age 2  . Stroke Father        89   Social History   Socioeconomic History  . Marital status: Divorced    Spouse name: Not on file  . Number of children: Not on file  . Years of education: Not on file  . Highest education level: Not on file  Occupational History  . Occupation: Archivist  Tobacco Use  . Smoking status: Current Every Day Smoker    Packs/day: 1.50    Years: 30.00    Pack years: 45.00    Types: Cigarettes  . Smokeless tobacco: Never Used  Vaping Use  . Vaping Use: Never used  Substance and Sexual Activity  . Alcohol use: Yes    Alcohol/week: 4.0 - 5.0 standard drinks    Types: 4 - 5 Standard drinks or equivalent per week    Comment: occassional  . Drug use: Not Currently    Comment: marijuana  . Sexual activity: Yes    Comment: GF already has genital herpes  Other Topics Concern  . Not on file  Social History Narrative   Divorced. GF 10 years in 2018. Son from prior marriage. No grandkids.       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       HObbies: time with gf, playing guitar, time in [place]- [place] house  [place] with GF. Enjoys hiking.    Social Determinants of Health   Financial Resource Strain: Low Risk   . Difficulty of Paying Living Expenses: Not hard at all  Food Insecurity: No Food Insecurity  . Worried About Charity fundraiser in the Last Year: Never true  . Ran Out of Food in the Last Year: Never true  Transportation Needs: No Transportation Needs  . Lack of Transportation (Medical): No  . Lack of Transportation (Non-Medical): No  Physical Activity: Inactive  . Days of Exercise per Week: 0 days  . Minutes of Exercise per Session: 0 min  Stress: No Stress Concern Present  . Feeling of Stress : Not at all  Social Connections: Socially Isolated  . Frequency of Communication with Friends and Family: More than three times a week  . Frequency of Social Gatherings with Friends and Family: More than three times a week  . Attends Religious Services: Never  . Active Member of Clubs or Organizations: No  . Attends Archivist Meetings: Never  . Marital Status: Divorced    Tobacco Counseling Ready to quit: Not Answered Counseling given: Not Answered   Clinical Intake:  Pre-visit preparation completed: Yes  Pain : No/denies pain     BMI - recorded: 26.58 Nutritional Status: BMI 25 -29 Overweight Diabetes: No  How often do you need to have someone help you when you read instructions, pamphlets, or other written materials from your doctor or pharmacy?: 1 - Never  Diabetic?No  Interpreter Needed?: No  Information entered by :: Charlott Rakes, LPN   Activities of Daily Living In your present state of health, do you have any difficulty performing the following activities: 04/01/2020  Hearing? Y  Comment denies hearing aids at this time, mild hearing loss  Vision? N  Difficulty concentrating or making decisions? N  Walking or climbing stairs? N  Dressing or bathing? N  Doing errands, shopping? N  Preparing Food and eating ? N  Using the Toilet? N  In the  past six months, have you accidently leaked urine? Y  Comment urgency at times  Do you have problems with loss of bowel control? N  Managing your Medications? N  Managing your Finances? N  Housekeeping or managing your Housekeeping? N  Some recent data might be hidden    Patient Care Team: Marin Olp, MD as PCP - General (Family Medicine)  Indicate any recent Medical Services you may have received from other than [facility] providers in the past year (date may be approximate).     Assessment:   This is a routine wellness examination for [place].  Hearing/Vision screen  Hearing Screening   125Hz  250Hz  500Hz   1000Hz  2000Hz  3000Hz  4000Hz  6000Hz  8000Hz   Right ear:           Left ear:           Comments: Mild hearing loss not enough to get hearing aids  Vision Screening Comments: Declines an eye exam at this time  Dietary issues and exercise activities discussed: Current Exercise Habits: The patient does not participate in regular exercise at present, Exercise limited by: None identified  Goals    . Complete colonoscopy prior to August    . Patient Stated     None at this time      Depression Screen PHQ 2/9 Scores 04/01/2020 03/26/2019 11/27/2017 09/25/2017 09/01/2016 11/10/2015 11/10/2015  PHQ - 2 Score 0 0 0 0 0 0 0  PHQ- 9 Score - - 0 - - - -    Fall Risk Fall Risk  04/01/2020 03/26/2019 11/27/2017 09/25/2017 09/01/2016  Falls in the past year? 0 0 No No No  Number falls in past yr: 0 0 - - -  Injury with Fall? 0 0 - - -  Risk for fall due to : Impaired vision - - - -  Follow up Falls prevention discussed Education provided - - -    Any stairs in or around the home? Yes  If so, are there any without handrails? No  Home free of loose throw rugs in walkways, pet beds, electrical cords, etc? Yes  Adequate lighting in your home to reduce risk of falls? Yes   ASSISTIVE DEVICES UTILIZED TO PREVENT FALLS:  Life alert? No  Use of a cane, walker or w/c? No  Grab bars in the  bathroom? Yes  Shower chair or bench in shower? No  Elevated toilet seat or a handicapped toilet? Yes   TIMED UP AND GO:  Was the test performed? No .    Cognitive Function:     6CIT Screen 04/01/2020  What Year? 0 points  What month? 0 points  Count back from 20 0 points  Months in reverse 0 points  Repeat phrase 0 points    Immunizations  There is no immunization history on file for this patient.  TDAP status: Due, Education has been provided regarding the importance of this vaccine. Advised may receive this vaccine at local pharmacy or Health Dept. Aware to provide a copy of the vaccination record if obtained from local pharmacy or Health Dept. Verbalized acceptance and understanding. Flu Vaccine status: Declined, Education has been provided regarding the importance of this vaccine but patient still declined. Advised may receive this vaccine at local pharmacy or Health Dept. Aware to provide a copy of the vaccination record if obtained from local pharmacy or Health Dept. Verbalized acceptance and understanding. Pneumococcal vaccine status: Declined,  Education has been provided regarding the importance of this vaccine but patient still declined. Advised may receive this vaccine at local pharmacy or Health Dept. Aware to provide a copy of the vaccination record if obtained from local pharmacy or Health Dept. Verbalized acceptance and understanding.  Covid-19 vaccine status: Declined, Education has been provided regarding the importance of this vaccine but patient still declined. Advised may receive this vaccine at local pharmacy or Health Dept.or vaccine clinic. Aware to provide a copy of the vaccination record if obtained from local pharmacy or Health Dept. Verbalized acceptance and understanding.  Qualifies for Shingles Vaccine? Yes   Zostavax completed No   Shingrix Completed?: No.    Education has been provided regarding the importance of this vaccine.  Patient has been advised to  call insurance company to determine out of pocket expense if they have not yet received this vaccine. Advised may also receive vaccine at local pharmacy or Health Dept. Verbalized acceptance and understanding.  Screening Tests Health Maintenance  Topic Date Due  . COVID-19 Vaccine (1) Never done  . TETANUS/TDAP  Never done  . COLONOSCOPY  Never done  . PNA vac Low Risk Adult (1 of 2 - PCV13) Never done  . INFLUENZA VACCINE  03/14/2020  . Hepatitis C Screening  Completed    Health Maintenance  Health Maintenance Due  Topic Date Due  . COVID-19 Vaccine (1) Never done  . TETANUS/TDAP  Never done  . COLONOSCOPY  Never done  . PNA vac Low Risk Adult (1 of 2 - PCV13) Never done  . INFLUENZA VACCINE  03/14/2020    Colorectal cancer screening: No longer required.   Lung Cancer Screening: (Low Dose CT Chest recommended if Age 70-80 years, 30 pack-year currently smoking OR have quit w/in 15years.) does qualify.    Lung Cancer Screening Referral: Declines  Additional Screening:  Hepatitis C Screening:  Completed 09/25/17  Vision Screening: Recommended annual ophthalmology exams for early detection of glaucoma and other disorders of the eye. Is the patient up to date with their annual eye exam?  No  Who is the provider or what is the name of the office in which the patient attends annual eye exams? Declines at this time, importance explained   If pt is not established with a provider, would they like to be referred to a provider to establish care? No .   Dental Screening: Recommended annual dental exams for proper oral hygiene  Community Resource Referral / Chronic Care Management: CRR required this visit?  No   CCM required this visit?  No      Plan:     I have personally reviewed and noted the following in the patient's chart:   . Medical and social history . Use of alcohol, tobacco or illicit drugs  . Current medications and supplements . Functional ability and  status . Nutritional status . Physical activity . Advanced directives . List of other physicians . Hospitalizations, surgeries, and ER visits in previous 12 months . Vitals . Screenings to include cognitive, depression, and falls . Referrals and appointments  In addition, I have reviewed and discussed with patient certain preventive protocols, quality metrics, and best practice recommendations. A written personalized care plan for preventive services as well as general preventive health recommendations were provided to patient.     Willette Brace, LPN   [id number]   Nurse Notes: None

## 2020-04-13 ENCOUNTER — Ambulatory Visit: Payer: PPO | Admitting: Family Medicine

## 2020-07-13 ENCOUNTER — Telehealth: Payer: Self-pay

## 2020-07-13 NOTE — Telephone Encounter (Signed)
Patient called in requesting to speak with Dr.Hunter about covid symptoms, I asked Keith Curtis if he was having any symptoms to which he told me no then asked for clarification. [name] states he got his first covid shot 4 weeks ago and when he went to get the second one he was having chills so they canceled his appointment and told him to contact pcp for advice to see if it is okay to get the second shot.

## 2020-07-13 NOTE — Telephone Encounter (Signed)
See below

## 2020-07-13 NOTE — Telephone Encounter (Signed)
Just to be on the safe side lets get him set up for COVID-19 testing on Thursday under [facility].  If he test negative for that then he can proceed with immunization

## 2020-07-14 NOTE — Telephone Encounter (Signed)
Called and lm on pt vm tcb, when pt calls back please get both dates of the Moderna vaccine so I can update his record.

## 2020-07-14 NOTE — Telephone Encounter (Signed)
Please schedule pt for COVID testing for this Thursday.

## 2020-07-14 NOTE — Telephone Encounter (Signed)
I called patient and he states [facility] was able to get him scheduled for his second shot, has no symptoms currently. Keith Curtis wanted to update chart that he has received two of the moderna covid shots

## 2020-08-14 DIAGNOSIS — C801 Malignant (primary) neoplasm, unspecified: Secondary | ICD-10-CM

## 2020-08-14 HISTORY — DX: Malignant (primary) neoplasm, unspecified: C80.1

## 2020-10-29 ENCOUNTER — Emergency Department (HOSPITAL_COMMUNITY): Payer: PPO

## 2020-10-29 ENCOUNTER — Telehealth: Payer: Self-pay

## 2020-10-29 ENCOUNTER — Emergency Department (HOSPITAL_COMMUNITY)
Admission: EM | Admit: 2020-10-29 | Discharge: 2020-10-30 | Disposition: A | Discharge status: Home / Self Care | Payer: PPO | Attending: Emergency Medicine | Admitting: Emergency Medicine

## 2020-10-29 ENCOUNTER — Encounter (HOSPITAL_COMMUNITY): Payer: Self-pay | Admitting: *Deleted

## 2020-10-29 ENCOUNTER — Other Ambulatory Visit: Payer: Self-pay

## 2020-10-29 DIAGNOSIS — Z20822 Contact with and (suspected) exposure to covid-19: Secondary | ICD-10-CM | POA: Diagnosis not present

## 2020-10-29 DIAGNOSIS — R059 Cough, unspecified: Secondary | ICD-10-CM | POA: Diagnosis not present

## 2020-10-29 DIAGNOSIS — R0602 Shortness of breath: Secondary | ICD-10-CM | POA: Diagnosis not present

## 2020-10-29 DIAGNOSIS — J449 Chronic obstructive pulmonary disease, unspecified: Secondary | ICD-10-CM | POA: Diagnosis not present

## 2020-10-29 DIAGNOSIS — J40 Bronchitis, not specified as acute or chronic: Secondary | ICD-10-CM | POA: Diagnosis not present

## 2020-10-29 DIAGNOSIS — F17210 Nicotine dependence, cigarettes, uncomplicated: Secondary | ICD-10-CM | POA: Insufficient documentation

## 2020-10-29 LAB — CBC
HCT: 51.8 % (ref 39.0–52.0)
Hemoglobin: 16.9 g/dL (ref 13.0–17.0)
MCH: 30.5 pg (ref 26.0–34.0)
MCHC: 32.6 g/dL (ref 30.0–36.0)
MCV: 93.5 fL (ref 80.0–100.0)
Platelets: 212 10*3/uL (ref 150–400)
RBC: 5.54 MIL/uL (ref 4.22–5.81)
RDW: 13.7 % (ref 11.5–15.5)
WBC: 11.5 10*3/uL — ABNORMAL HIGH (ref 4.0–10.5)
nRBC: 0 % (ref 0.0–0.2)

## 2020-10-29 LAB — BASIC METABOLIC PANEL
Anion gap: 9 (ref 5–15)
BUN: 14 mg/dL (ref 8–23)
CO2: 27 mmol/L (ref 22–32)
Calcium: 8.8 mg/dL — ABNORMAL LOW (ref 8.9–10.3)
Chloride: 102 mmol/L (ref 98–111)
Creatinine, Ser: 1.02 mg/dL (ref 0.61–1.24)
GFR, Estimated: >60 mL/min (ref >60)
Glucose, Bld: 112 mg/dL — ABNORMAL HIGH (ref 70–99)
Potassium: 4.2 mmol/L (ref 3.5–5.1)
Sodium: 138 mmol/L (ref 135–145)

## 2020-10-29 LAB — TROPONIN I (HIGH SENSITIVITY): Troponin I (High Sensitivity): 8 ng/L (ref <18)

## 2020-10-29 LAB — SARS CORONAVIRUS 2 (TAT 6-24 HRS): SARS Coronavirus 2: NEGATIVE

## 2020-10-29 MED ORDER — ALBUTEROL SULFATE HFA 108 (90 BASE) MCG/ACT IN AERS
2.0000 | INHALATION_SPRAY | RESPIRATORY_TRACT | Status: DC | PRN
  Administered 2020-10-30: 2 via RESPIRATORY_TRACT
  Filled 2020-10-29 (×2): qty 6.7

## 2020-10-29 NOTE — ED Triage Notes (Signed)
Pt is here for sob.  Pt reports that this began with URI symptoms a few days ago but the sob has gotten worse.

## 2020-10-29 NOTE — Telephone Encounter (Signed)
Called patient back, he stated that he is having a cough, congested and has breathing difficulty, he sounded weak via the phone. Pt insist he wants to see Dr. Yong Channel or someone in the office, explained that Dr. Yong Channel is on vacation today and we have no other openings for the day. Apologized for the inconvenience and advised to go to urgent care or ER to be evaluated. Pt sounds upset and said ok, I will go somewhere else and hung up the call.

## 2020-10-29 NOTE — ED Notes (Signed)
Patient told this tech he was going outside to smoke, patient on 4L of o2 this tech told pt he could not smoke on oxygen, patient said he would leave it here and come back. This tech told patient that it was strongly advised he did not do this. This tech told patient that he needed to remain on oxygen, patient said he was going outside to smoke and left the oxygen tank with this tech.

## 2020-10-29 NOTE — Telephone Encounter (Signed)
Had patient speak with Hasna.

## 2020-10-29 NOTE — Telephone Encounter (Signed)
Nurse Assessment Nurse: Loletha Carrow RN, Ronalee Belts Date/Time (Eastern Time): 10/29/2020 9:24:53 AM Confirm and document reason for call. If symptomatic, describe symptoms. ---Caller states: DIB w productive Cough and Anxiety since last night. Denies any other symptoms Does the patient have any new or worsening symptoms? ---Yes Will a triage be completed? ---Yes Related visit to physician within the last 2 weeks? ---No Does the PT have any chronic conditions? (i.e. diabetes, asthma, this includes High risk factors for pregnancy, etc.) ---No Is this a behavioral health or substance abuse call? ---No Guidelines Guideline Title Affirmed Question Affirmed Notes Nurse Date/Time (Eastern Time) Breathing Difficulty Patient sounds very sick or weak to the triager Emch, RN, Ronalee Belts 10/29/2020 9:27:02 AM Disp. Time Eilene Ghazi Time) Disposition Final User 10/29/2020 9:21:53 AM Send to Urgent Paula Compton, Tyrechia 10/29/2020 9:30:28 AM Go to ED Now (or PCP triage) Yes Emch, RN, Vicenta Dunning Disagree/Comply Disagree Caller Understands Yes PLEASE NOTE: All timestamps contained within this report are represented as [place] Standard Time. CONFIDENTIALTY NOTICE: This fax transmission is intended only for the addressee. It contains information that is legally privileged, confidential or otherwise protected from use or disclosure. If you are not the intended recipient, you are strictly prohibited from reviewing, disclosing, copying using or disseminating any of this information or taking any action in reliance on or regarding this information. If you have received this fax in error, please notify us immediately by telephone so that we can arrange for its return to [place]. Phone: [phone], Toll-Free: [phone], Fax: [phone] Page: 2 of 2 Call Id: [id number] PreDisposition Did not know what to do Care Advice Given Per Guideline GO TO ED NOW (OR PCP TRIAGE): * IF PCP SECOND-LEVEL TRIAGE REQUIRED: You may need to be seen.  Your doctor (or NP/PA) will want to talk with you to decide what's best. I'll page the provider on-call now. If you haven't heard from the provider (or me) within 30 minutes, go directly to the [facility] at _____________ [place]: * It is better and safer if another adult drives instead of you. CARE ADVICE given per Breathing Difficulty (Adult) guideline. Comments User: Doretha Sou, RN Date/Time Eilene Ghazi Time): 10/29/2020 9:34:50 AM Caller added @ the end of the assessment " Ive has cold symptoms for quite some time now runny nose cough sore throat" Calleer was advised to seek treatment in the next hour and refused. would rather come intoi the office this after noon anfter being up all night unable to sleep sut to difficulty breathing and anxiety Referrals GO TO FACILITY REFUSE

## 2020-10-29 NOTE — ED Notes (Signed)
Pt came back into lobby. Pt stated he was sick of waiting inside and wanted to take the O2 tank outside with him and wait outside. Pt was informed that in order to use the O2 tank, pt must stay in inside. Pt getting very irritated with NT because we won't let him take the O2 tank outside with him. Pt doesn't want to stay inside. Pt advised to stay inside with the O2 tank.

## 2020-10-29 NOTE — ED Notes (Signed)
This tech asked patient to wear mask while in the waiting room, patient raised his voice and told this tech "I am trying but I am coughing up mucus what do you want me to do" this tech told patient he must wear his mask especially while he is actively coughing.

## 2020-10-30 LAB — TROPONIN I (HIGH SENSITIVITY): Troponin I (High Sensitivity): 9 ng/L (ref <18)

## 2020-10-30 MED ORDER — IPRATROPIUM-ALBUTEROL 0.5-2.5 (3) MG/3ML IN SOLN
3.0000 mL | RESPIRATORY_TRACT | Status: AC
Start: 2020-10-30 — End: 2020-10-30
  Administered 2020-10-30: 3 mL via RESPIRATORY_TRACT
  Filled 2020-10-30: qty 3

## 2020-10-30 MED ORDER — HYDROXYZINE HCL 10 MG PO TABS
10.0000 mg | ORAL_TABLET | Freq: Once | ORAL | Status: DC
  Filled 2020-10-30: qty 1

## 2020-10-30 MED ORDER — ALBUTEROL SULFATE HFA 108 (90 BASE) MCG/ACT IN AERS: 2.0000 | INHALATION_SPRAY | RESPIRATORY_TRACT | 0 refills | Status: DC | PRN

## 2020-10-30 MED ORDER — AMOXICILLIN-POT CLAVULANATE 875-125 MG PO TABS
1.0000 | ORAL_TABLET | Freq: Once | ORAL | Status: AC
  Administered 2020-10-30: 1 via ORAL
  Filled 2020-10-30: qty 1

## 2020-10-30 MED ORDER — AMOXICILLIN-POT CLAVULANATE 875-125 MG PO TABS: 1.0000 | ORAL_TABLET | Freq: Two times a day (BID) | ORAL | 0 refills | Status: DC

## 2020-10-30 MED ORDER — PREDNISONE 20 MG PO TABS
60.0000 mg | ORAL_TABLET | Freq: Once | ORAL | Status: AC
  Administered 2020-10-30: 60 mg via ORAL
  Filled 2020-10-30: qty 3

## 2020-10-30 MED ORDER — NICOTINE 21 MG/24HR TD PT24
21.0000 mg | MEDICATED_PATCH | Freq: Every day | TRANSDERMAL | Status: DC
  Filled 2020-10-30: qty 1

## 2020-10-30 MED ORDER — PREDNISONE 20 MG PO TABS: ORAL_TABLET | ORAL | 0 refills | Status: DC

## 2020-10-30 NOTE — ED Notes (Addendum)
Pt noted to be irate, approached the nurses station yelling loudly about lengthy wait to be seen and provider being at bedside "less than five minutes", pt expresses concerns for  SOB, states he cannot breathe and would only be able to do so outside. This RN assisted pt back to bed and administered PRN medication. Notified provider of same. Provider currently working on more orders for pt.

## 2020-10-30 NOTE — ED Provider Notes (Signed)
[facility] EMERGENCY DEPARTMENT Provider Note   CSN: [id number] Arrival date & time: 10/29/20  1327     History Chief Complaint  Patient presents with  . Shortness of Breath    Keith Curtis is a 70 y.o. male.  The history is provided by the patient and a relative.  Shortness of Breath Severity:  Mild Onset quality:  Gradual Duration:  5 weeks Timing:  Constant Progression:  Worsening Chronicity:  New Context: not activity   Relieved by:  None tried Worsened by:  Nothing Ineffective treatments:  None tried Associated symptoms: cough, sputum production and wheezing   Associated symptoms: no abdominal pain and no fever        Past Medical History:  Diagnosis Date  . Erectile dysfunction    buys sildenafil from [place] or other- 40 or 60mg   . Genital herpes    last outbreak 2012 or before  . 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 Tobacco abuse    1.5 PPD. uses as a crutch. not ready to quit    Patient Active Problem List   Diagnosis Date Noted  . Varicose veins of both lower extremities 10/15/2018  . Hypertriglyceridemia 03/25/2018  . Knee osteoarthritis 09/01/2016  . Lipoma of left shoulder 09/01/2016  . Tobacco dependence   . Marijuana use   . Genital herpes   . Erectile dysfunction     Past Surgical History:  Procedure Laterality Date  . APPENDECTOMY  1975  . CYST EXCISION     left shoulder blade  . hernia bilateral     inguinal- both sides- 3 surgeries total. around 2009 last surgery.        Family History  Problem Relation Age of Onset  . Other Mother        died in 61s- unknown cause  . Heart attack Father        died at age 52  . Stroke Father        7    Social History   Tobacco Use  . Smoking status: Current Every Day Smoker    Packs/day: 1.50    Years: 30.00    Pack years: 45.00    Types: Cigarettes  . Smokeless tobacco: Never Used  Vaping Use  . Vaping Use: Never used  Substance Use Topics  .  Alcohol use: Yes    Alcohol/week: 4.0 - 5.0 standard drinks    Types: 4 - 5 Standard drinks or equivalent per week    Comment: occassional  . Drug use: Not Currently    Comment: marijuana    Home Medications Prior to Admission medications   Medication Sig Start Date End Date Taking? Authorizing Provider  albuterol (VENTOLIN HFA) 108 (90 Base) MCG/ACT inhaler Inhale 2 puffs into the lungs every 4 (four) hours as needed for wheezing or shortness of breath. 10/30/20  Yes Mesner, Corene Cornea, MD  amoxicillin-clavulanate (AUGMENTIN) 875-125 MG tablet Take 1 tablet by mouth 2 (two) times daily. One po bid x 7 days 10/30/20  Yes Mesner, Corene Cornea, MD  predniSONE (DELTASONE) 20 MG tablet 2 tabs po daily x 4 days 10/30/20  Yes Mesner, Corene Cornea, MD  sildenafil (VIAGRA) 50 MG tablet Take 50 mg by mouth daily as needed for erectile dysfunction.    [provider]  triamcinolone ointment (KENALOG) 0.5 % Apply 1 application topically 2 (two) times daily. 12/18/19   Marin Olp, MD    Allergies    Patient has no  known allergies.  Review of Systems   Review of Systems  Constitutional: Negative for fever.  Respiratory: Positive for cough, sputum production, shortness of breath and wheezing.   Gastrointestinal: Negative for abdominal pain.  All other systems reviewed and are negative.   Physical Exam Updated Vital Signs BP 113/84 (BP Location: Left Arm)   Pulse 89   Temp 98 F (36.7 C)   Resp 20   SpO2 91% Comment: After ambulating  Physical Exam Vitals and nursing note reviewed.  Constitutional:      Appearance: He is well-developed.  HENT:     Head: Normocephalic and atraumatic.  Cardiovascular:     Rate and Rhythm: Normal rate.  Pulmonary:     Effort: Pulmonary effort is normal. Tachypnea present. No respiratory distress.     Breath sounds: Decreased breath sounds and wheezing present.  Abdominal:     General: There is no distension.  Musculoskeletal:        General: Normal range of  motion.     Cervical back: Normal range of motion.  Skin:    General: Skin is warm and dry.  Neurological:     General: No focal deficit present.     Mental Status: He is alert.     ED Results / Procedures / Treatments   Labs (all labs ordered are listed, but only abnormal results are displayed) Labs Reviewed  CBC - Abnormal; Notable for the following components:      Result Value   WBC 11.5 (*)    All other components within normal limits  BASIC METABOLIC PANEL - Abnormal; Notable for the following components:   Glucose, Bld 112 (*)    Calcium 8.8 (*)    All other components within normal limits  SARS CORONAVIRUS 2 (TAT 6-24 HRS)  TROPONIN I (HIGH SENSITIVITY)  TROPONIN I (HIGH SENSITIVITY)    EKG EKG Interpretation  Date/Time:  Friday October 29 2020 13:33:04 EDT Ventricular Rate:  98 PR Interval:  156 QRS Duration: 112 QT Interval:  334 QTC Calculation: 426 R Axis:   108 Text Interpretation: Normal sinus rhythm Pulmonary disease pattern Right bundle branch block Abnormal ECG Since last tracing rate faster Otherwise no significant change Confirmed by Addison Lank [phone]) on 10/29/2020 11:15:23 PM   Radiology DG Chest 2 View  Result Date: 10/29/2020 CLINICAL DATA:  Shortness of breath and cough. EXAM: CHEST - 2 VIEW COMPARISON:  Chest x-ray dated November 13, 2007. FINDINGS: The heart size and mediastinal contours are within normal limits. Normal pulmonary vascularity. The lungs remain hyperinflated. No focal consolidation, pleural effusion, or pneumothorax. No acute osseous abnormality. IMPRESSION: 1. No acute cardiopulmonary disease. 2. COPD. Electronically Signed   By: Titus Dubin M.D.   On: 10/29/2020 15:48    Procedures Procedures   Medications Ordered in ED Medications  albuterol (VENTOLIN HFA) 108 (90 Base) MCG/ACT inhaler 2 puff (2 puffs Inhalation Given 10/30/20 0040)  ipratropium-albuterol (DUONEB) 0.5-2.5 (3) MG/3ML nebulizer solution 3 mL (3 mLs  Nebulization Given 10/30/20 0229)  nicotine (NICODERM CQ - dosed in mg/24 hours) patch 21 mg (has no administration in time range)  hydrOXYzine (ATARAX/VISTARIL) tablet 10 mg (10 mg Oral Patient Refused/Not Given 10/30/20 0136)  predniSONE (DELTASONE) tablet 60 mg (60 mg Oral Given 10/30/20 0053)  amoxicillin-clavulanate (AUGMENTIN) 875-125 MG per tablet 1 tablet (1 tablet Oral Given 10/30/20 0055)    ED Course  I have reviewed the triage vital signs and the nursing notes.  Pertinent labs & imaging results that  were available during my care of the patient were reviewed by me and considered in my medical decision making (see chart for details).    MDM Rules/Calculators/A&P                          70 year old longtime smoker here with wheezing, productive cough consistent with likely bronchitis.  Suspect he has underlying COPD has been undiagnosed.  Patient was given some breathing treatments, steroids and antibiotics here in the emergency room.  Patient had significant improvement.  Wheezing improved.  Opened up his airways a little bit.  No obvious consolidative process on his x-ray.  Will discharge on COPD medications and antibiotics.  Referral to pulmonology placed.  Final Clinical Impression(s) / ED Diagnoses Final diagnoses:  Bronchitis  Shortness of breath    Rx / DC Orders ED Discharge Orders         Ordered    amoxicillin-clavulanate (AUGMENTIN) 875-125 MG tablet  2 times daily        10/30/20 0342    predniSONE (DELTASONE) 20 MG tablet        10/30/20 0342    albuterol (VENTOLIN HFA) 108 (90 Base) MCG/ACT inhaler  Every 4 hours PRN        10/30/20 0342           Mesner, Corene Cornea, MD 10/30/20 [phone]

## 2021-04-07 ENCOUNTER — Ambulatory Visit: Payer: PPO

## 2021-04-25 ENCOUNTER — Encounter: Payer: Self-pay | Admitting: Family Medicine

## 2021-04-25 ENCOUNTER — Ambulatory Visit (INDEPENDENT_AMBULATORY_CARE_PROVIDER_SITE_OTHER): Payer: PPO | Admitting: Family Medicine

## 2021-04-25 ENCOUNTER — Other Ambulatory Visit: Payer: Self-pay

## 2021-04-25 VITALS — BP 110/62 | HR 54 | Temp 98.0°F | Resp 16 | Wt 193.8 lb

## 2021-04-25 DIAGNOSIS — Z1211 Encounter for screening for malignant neoplasm of colon: Secondary | ICD-10-CM | POA: Diagnosis not present

## 2021-04-25 DIAGNOSIS — R310 Gross hematuria: Secondary | ICD-10-CM

## 2021-04-25 DIAGNOSIS — N5201 Erectile dysfunction due to arterial insufficiency: Secondary | ICD-10-CM

## 2021-04-25 DIAGNOSIS — L989 Disorder of the skin and subcutaneous tissue, unspecified: Secondary | ICD-10-CM | POA: Diagnosis not present

## 2021-04-25 DIAGNOSIS — R6882 Decreased libido: Secondary | ICD-10-CM

## 2021-04-25 DIAGNOSIS — R0602 Shortness of breath: Secondary | ICD-10-CM

## 2021-04-25 DIAGNOSIS — R351 Nocturia: Secondary | ICD-10-CM

## 2021-04-25 DIAGNOSIS — E781 Pure hyperglyceridemia: Secondary | ICD-10-CM

## 2021-04-25 DIAGNOSIS — J449 Chronic obstructive pulmonary disease, unspecified: Secondary | ICD-10-CM | POA: Diagnosis not present

## 2021-04-25 DIAGNOSIS — F17200 Nicotine dependence, unspecified, uncomplicated: Secondary | ICD-10-CM

## 2021-04-25 MED ORDER — TRIAMCINOLONE ACETONIDE 0.1 % EX CREA: 1.0000 "application " | TOPICAL_CREAM | Freq: Two times a day (BID) | CUTANEOUS | 0 refills | Status: AC

## 2021-04-25 NOTE — Assessment & Plan Note (Signed)
#   ED visit for SOB- found to have COPD S:Patient was seen on 10/29/2020 at [facility] ED with Dr.Mesner for shortness of breath.  Patient was thought to have potential underlying COPD-treated for bronchitis with-medications ordered: albuterol hfa 108 mcg, Prednisone 60 mg and Augmentin 875-125 mg.  He also responded well to nebulizer in the emergency room  - does have some underlying SOB- albuterol helps some with this A/P: found to have COPD on CXR- using albuterol once a week and helpful- offered to refer to pulmonology for PFTs/work up- he declines -strongly encouraged smoking cessation- continue albuterol prn

## 2021-04-25 NOTE — Progress Notes (Signed)
Phone [phone] In person visit   Subjective:   Keith Curtis is a 70 y.o. year old very pleasant male patient who presents for/with See problem oriented charting Chief Complaint  Patient presents with   skin issues   This visit occurred during the SARS-CoV-2 public health emergency.  Safety protocols were in place, including screening questions prior to the visit, additional usage of staff PPE, and extensive cleaning of exam room while observing appropriate contact time as indicated for disinfecting solutions.   Past Medical History-  Patient Active Problem List   Diagnosis Date Noted   COPD (chronic obstructive pulmonary disease) ([place]) 04/25/2021    Priority: High   Tobacco dependence     Priority: High   Hypertriglyceridemia 03/25/2018    Priority: Medium   Lipoma of left shoulder 09/01/2016    Priority: Medium   Marijuana use     Priority: Medium   Knee osteoarthritis 09/01/2016    Priority: Low   Genital herpes     Priority: Low   Erectile dysfunction     Priority: Low   Varicose veins of both lower extremities 10/15/2018    Medications- reviewed and updated Current Outpatient Medications  Medication Sig Dispense Refill   albuterol (VENTOLIN HFA) 108 (90 Base) MCG/ACT inhaler Inhale 2 puffs into the lungs every 4 (four) hours as needed for wheezing or shortness of breath. 1 each 0   sildenafil (VIAGRA) 50 MG tablet Take 50 mg by mouth daily as needed for erectile dysfunction.     triamcinolone cream (KENALOG) 0.1 % Apply 1 application topically 2 (two) times daily. For 7-10 days maximum 30 g 0   No current facility-administered medications for this visit.     Objective:  BP 110/62   Pulse (!) 54   Temp 98 F (36.7 C)   Resp 16   Wt 193 lb 12.8 oz (87.9 kg)   SpO2 94%   BMI 24.88 kg/m  Gen: NAD, resting comfortably CV: RRR no murmurs rubs or gallops Lungs: CTAB no crackles, wheeze, rhonchi Abdomen: soft/nontender/nondistended/normal bowel sounds. No  rebound or guarding.  Ext: no edema Skin: warm, dry, about 1 cm x 1 cm raised lesion on left forehead skin colored in areas and some light brown, similar to smaller 4x 4 mm lesoin on back of neck- both concerning for seborrheic keratosis. Finally there is a > 1 cm area on right upper back with asymmetry and border irregularity as well as some color change throughout the lesion-concerning for possible melanoma    Assessment and Plan   # Skin issues S: Patient with 3 skin lesions he is concerned about A/P: Area on left forehead and right neck appear to be seborrheic keratosis to me but I would like dermatology's opinion.  More importantly on his right upper back there is an area greater than 1 cm with some asymmetry and border irregularity and I am concerned about possible melanoma-placed urgent referral to dermatology  #hyperlipidemia/hypertriglyceridemia S: Medication: None Lab Results  Component Value Date   CHOL 161 09/25/2017   HDL 52.70 09/25/2017   LDLCALC 75 09/25/2017   TRIG 167.0 (H) 09/25/2017   CHOLHDL 3 09/25/2017   A/P: Poor control last check with LDL over 70.  Discussed updating lipid panel-discussed lifestyle vs medicine treatment   #Screening for prostate cancer-no PSAs on file-discussed getting a check this year and perhaps next year No results found for: PSA1, PSA    #Screening for colon cancer-discussed colonoscopy was Cologuard-patient reports he  will try colonoscopy after counseling   # ED visit for SOB- found to have COPD S:Patient was seen on 10/29/2020 at [facility] ED with Dr.Mesner for shortness of breath.  Patient was thought to have potential underlying COPD-treated for bronchitis with-medications ordered: albuterol hfa 108 mcg, Prednisone 60 mg and Augmentin 875-125 mg.  He also responded well to nebulizer in the emergency room  - does have some underlying SOB- albuterol helps some with this A/P: found to have COPD on CXR- using albuterol  once a week and helpful- offered to refer to pulmonology for PFTs/work up- he declines -strongly encouraged smoking cessation- continue albuterol prn  #Tobacco dependence S:1.5 packs/day.  Also uses marijuana on a daily basis  A/P: Strongly encourage smoking cessation and marijuana cessation-patient not ready to quit  -Discussed lung cancer screening program again -patient declines  -Discussed AAA scan/screening- opts in    # Gross hematuria S: Patient with gross hematuria at time of last visit in 2020 and concerning with 45-pack-year smoking history-he was referred to urology-I was not able to locate any records from his visit with them-asked patient he reports he never went and forgot about this.  We also recommended physical in 4 to 5 months but unfortunately he did not have a chance to  follow-up until over 2 years later-which is today.  No blood in urine since that time A/P: Thankfully no recurrence but was never seen by urology 2 years ago-we will refer for their expert opinion-discussed importance of following through with this due to risk of potential bladder cancer or urological cancer  # Erectile Dysfunction S:Viagra 50 mg daily as needed- helpful- has noted lower libido A/P: Between erectile dysfunction and lower libido reasonable to check testosterone level-he will come back fasting for these labs between 8:52 AM  # history of he reports psoriasis- appears to be more eczema on exam today- will refill triamcinolone for prn use  Recommended follow up: Return in about 6 months (around 10/23/2021) for physical or sooner if needed..  Lab/Order associations:   ICD-10-CM   1. Hypertriglyceridemia  E78.1 CBC with Differential/Platelet    Comprehensive metabolic panel    Lipid panel    2. Chronic obstructive pulmonary disease, unspecified COPD type ([place])  J44.9     3. Erectile dysfunction due to arterial insufficiency  N52.01 Testosterone Total,Free,Bio, Males-([facility])    4. Skin  lesions  L98.9 Ambulatory referral to Dermatology    5. Gross hematuria  R31.0 Ambulatory referral to Urology    6. Shortness of breath  R06.02     7. Nocturia  R35.1 PSA    8. Tobacco dependence  F17.200 US AORTA MEDICARE SCREENING    9. Low libido  R68.82 Testosterone Total,Free,Bio, Males-([facility])    10. Screen for colon cancer  Z12.11 Ambulatory referral to Gastroenterology     Meds ordered this encounter  Medications   triamcinolone cream (KENALOG) 0.1 %    Sig: Apply 1 application topically 2 (two) times daily. For 7-10 days maximum    Dispense:  30 g    Refill:  0   I,Jada Bradford,acting as a scribe for Garret Reddish, MD.,have documented all relevant documentation on the behalf of Garret Reddish, MD,as directed by  Garret Reddish, MD while in the presence of Garret Reddish, MD.  I, Garret Reddish, MD, have reviewed all documentation for this visit. The documentation on 04/25/21 for the exam, diagnosis, procedures, and orders are all accurate and complete.  Return precautions advised.  Garret Reddish, MD

## 2021-04-25 NOTE — Patient Instructions (Addendum)
Health Maintenance Due  Topic Date Due   COLONOSCOPY  -  We will call you within two weeks about your referral to GI. If you do not hear within 2 weeks, give [place] a call.   Never done   We will call you within two weeks about your referral to Aortic Aneurysm Screening. If you do not hear within 2 weeks, give [place] a call.   We will call you within two weeks about your referral to Urology. If you do not hear within 2 weeks, give [place] a call.   We will call you within two weeks about your referral to Dermatology. If you do not hear within 2 weeks, give [place] a call.   Team will place an urgent referral to dermatology-please discuss with referral coordinator and try to get him set up for this.  Schedule a lab visit at the check out desk within 2 weeks. Return for future fasting labs meaning through 8-9 am. Ok to take your medications with water.  Recommended follow up: Return in about 6 months (around 10/23/2021) for physical or sooner if needed.Marland Kitchen

## 2021-04-27 ENCOUNTER — Telehealth: Payer: Self-pay | Admitting: Dermatology

## 2021-04-27 NOTE — Telephone Encounter (Signed)
Patient is calling for a referral appointment from Garret Reddish, M.D.  Patient is scheduled for 10/31/2020 at 2:00 with Lavonna Monarch, M.D.

## 2021-04-28 ENCOUNTER — Other Ambulatory Visit: Payer: Self-pay

## 2021-04-28 ENCOUNTER — Other Ambulatory Visit (INDEPENDENT_AMBULATORY_CARE_PROVIDER_SITE_OTHER): Payer: PPO

## 2021-04-28 DIAGNOSIS — E781 Pure hyperglyceridemia: Secondary | ICD-10-CM

## 2021-04-28 DIAGNOSIS — R351 Nocturia: Secondary | ICD-10-CM | POA: Diagnosis not present

## 2021-04-28 DIAGNOSIS — R6882 Decreased libido: Secondary | ICD-10-CM

## 2021-04-28 DIAGNOSIS — N5201 Erectile dysfunction due to arterial insufficiency: Secondary | ICD-10-CM | POA: Diagnosis not present

## 2021-04-28 DIAGNOSIS — F17200 Nicotine dependence, unspecified, uncomplicated: Secondary | ICD-10-CM

## 2021-04-28 LAB — PSA: PSA: 3.05 ng/mL (ref 0.10–4.00)

## 2021-04-28 LAB — CBC WITH DIFFERENTIAL/PLATELET
Basophils Absolute: 0 10*3/uL (ref 0.0–0.1)
Basophils Relative: 0.7 % (ref 0.0–3.0)
Eosinophils Absolute: 0.4 10*3/uL (ref 0.0–0.7)
Eosinophils Relative: 7.4 % — ABNORMAL HIGH (ref 0.0–5.0)
HCT: 48 % (ref 39.0–52.0)
Hemoglobin: 15.9 g/dL (ref 13.0–17.0)
Lymphocytes Relative: 23.5 % (ref 12.0–46.0)
Lymphs Abs: 1.2 10*3/uL (ref 0.7–4.0)
MCHC: 33.2 g/dL (ref 30.0–36.0)
MCV: 92.9 fl (ref 78.0–100.0)
Monocytes Absolute: 0.6 10*3/uL (ref 0.1–1.0)
Monocytes Relative: 12.1 % — ABNORMAL HIGH (ref 3.0–12.0)
Neutro Abs: 3 10*3/uL (ref 1.4–7.7)
Neutrophils Relative %: 56.3 % (ref 43.0–77.0)
Platelets: 238 10*3/uL (ref 150.0–400.0)
RBC: 5.17 Mil/uL (ref 4.22–5.81)
RDW: 14.8 % (ref 11.5–15.5)
WBC: 5.3 10*3/uL (ref 4.0–10.5)

## 2021-04-28 LAB — COMPREHENSIVE METABOLIC PANEL
ALT: 13 U/L (ref 0–53)
AST: 15 U/L (ref 0–37)
Albumin: 4.1 g/dL (ref 3.5–5.2)
Alkaline Phosphatase: 67 U/L (ref 39–117)
BUN: 19 mg/dL (ref 6–23)
CO2: 29 mEq/L (ref 19–32)
Calcium: 9.2 mg/dL (ref 8.4–10.5)
Chloride: 102 mEq/L (ref 96–112)
Creatinine, Ser: 1 mg/dL (ref 0.40–1.50)
GFR: 76.09 mL/min (ref >60.00)
Glucose, Bld: 81 mg/dL (ref 70–99)
Potassium: 4.7 mEq/L (ref 3.5–5.1)
Sodium: 137 mEq/L (ref 135–145)
Total Bilirubin: 0.4 mg/dL (ref 0.2–1.2)
Total Protein: 6.5 g/dL (ref 6.0–8.3)

## 2021-04-28 LAB — LIPID PANEL
Cholesterol: 161 mg/dL (ref 0–200)
HDL: 61.7 mg/dL (ref >39.00)
LDL Cholesterol: 73 mg/dL (ref 0–99)
NonHDL: 99.6
Total CHOL/HDL Ratio: 3
Triglycerides: 134 mg/dL (ref 0.0–149.0)
VLDL: 26.8 mg/dL (ref 0.0–40.0)

## 2021-04-28 NOTE — Telephone Encounter (Signed)
Referral attached to appointment

## 2021-04-29 ENCOUNTER — Ambulatory Visit
Admission: RE | Admit: 2021-04-29 | Discharge: 2021-04-29 | Disposition: A | Discharge status: Home / Self Care | Payer: PPO | Source: Ambulatory Visit | Attending: Family Medicine | Admitting: Family Medicine

## 2021-04-29 DIAGNOSIS — Z87891 Personal history of nicotine dependence: Secondary | ICD-10-CM | POA: Diagnosis not present

## 2021-04-29 DIAGNOSIS — Z136 Encounter for screening for cardiovascular disorders: Secondary | ICD-10-CM | POA: Diagnosis not present

## 2021-04-29 LAB — TESTOSTERONE TOTAL,FREE,BIO, MALES
Albumin: 4.3 g/dL (ref 3.6–5.1)
Sex Hormone Binding: 78 nmol/L — ABNORMAL HIGH (ref 22–77)
Testosterone, Bioavailable: 124.9 ng/dL (ref 15.0–150.0)
Testosterone, Free: 63.4 pg/mL (ref 6.0–73.0)
Testosterone: 936 ng/dL — ABNORMAL HIGH (ref 250–827)

## 2021-04-30 ENCOUNTER — Encounter: Payer: Self-pay | Admitting: Family Medicine

## 2021-04-30 DIAGNOSIS — I700 Atherosclerosis of aorta: Secondary | ICD-10-CM | POA: Insufficient documentation

## 2021-05-16 DIAGNOSIS — X32XXXA Exposure to sunlight, initial encounter: Secondary | ICD-10-CM | POA: Diagnosis not present

## 2021-05-16 DIAGNOSIS — D0359 Melanoma in situ of other part of trunk: Secondary | ICD-10-CM | POA: Diagnosis not present

## 2021-05-16 DIAGNOSIS — D225 Melanocytic nevi of trunk: Secondary | ICD-10-CM | POA: Diagnosis not present

## 2021-05-16 DIAGNOSIS — L570 Actinic keratosis: Secondary | ICD-10-CM | POA: Diagnosis not present

## 2021-05-16 DIAGNOSIS — L308 Other specified dermatitis: Secondary | ICD-10-CM | POA: Diagnosis not present

## 2021-05-16 DIAGNOSIS — Z1283 Encounter for screening for malignant neoplasm of skin: Secondary | ICD-10-CM | POA: Diagnosis not present

## 2021-05-23 DIAGNOSIS — D0359 Melanoma in situ of other part of trunk: Secondary | ICD-10-CM | POA: Diagnosis not present

## 2021-05-23 DIAGNOSIS — L821 Other seborrheic keratosis: Secondary | ICD-10-CM | POA: Diagnosis not present

## 2021-05-23 DIAGNOSIS — L814 Other melanin hyperpigmentation: Secondary | ICD-10-CM | POA: Diagnosis not present

## 2021-06-01 ENCOUNTER — Telehealth: Payer: Self-pay

## 2021-06-01 NOTE — Telephone Encounter (Signed)
Pt called stating that he will not being going to the dermatologist that Dr Yong Channel referred him to. Keith Curtis stated that he has already seen Dr Allyn Kenner in [place]. He stated that he wants to follow up with Dr Yong Channel. I offered an appt but denied. Keith Curtis wants Dr Yong Channel to call him so he can discuss what is going on. Keith Curtis stated that Dr Yong Channel needs to follow up with him. Please Advise.

## 2021-06-02 NOTE — Telephone Encounter (Signed)
Spoke with patient, advised to discuss anything with Dr. Yong Channel, he needs an appt.  States that he just wants to discuss what Dr. [place] Crane did.  Advised I will request records from Dr. Durene Cal office for Dr. Yong Channel to review

## 2021-06-02 NOTE — Telephone Encounter (Signed)
LMOVM advising patient to return my call

## 2021-06-09 DIAGNOSIS — N401 Enlarged prostate with lower urinary tract symptoms: Secondary | ICD-10-CM | POA: Diagnosis not present

## 2021-06-09 DIAGNOSIS — R3915 Urgency of urination: Secondary | ICD-10-CM | POA: Diagnosis not present

## 2021-06-09 DIAGNOSIS — E281 Androgen excess: Secondary | ICD-10-CM | POA: Diagnosis not present

## 2021-06-22 ENCOUNTER — Encounter: Payer: Self-pay | Admitting: Gastroenterology

## 2021-08-19 ENCOUNTER — Ambulatory Visit (AMBULATORY_SURGERY_CENTER): Payer: PPO | Admitting: *Deleted

## 2021-08-19 ENCOUNTER — Other Ambulatory Visit: Payer: Self-pay

## 2021-08-19 VITALS — Ht 74.0 in | Wt 193.0 lb

## 2021-08-19 DIAGNOSIS — Z1211 Encounter for screening for malignant neoplasm of colon: Secondary | ICD-10-CM

## 2021-08-19 MED ORDER — PEG 3350-KCL-NA BICARB-NACL 420 G PO SOLR: 4000.0000 mL | Freq: Once | ORAL | 0 refills | Status: AC

## 2021-08-19 NOTE — Progress Notes (Signed)
Patient's pre-visit was done today over the phone with the patient. Name,DOB and address verified. Patient denies any allergies to Eggs and Soy. Patient denies any problems with anesthesia/sedation. Patient is not taking any diet pills or blood thinners. No home Oxygen. Packet of Prep instructions mailed to patient including a copy of a consent form-pt is aware. Prep instructions sent to pt's MyChart (if activated).Patient understands to call us back with any questions or concerns. Patient is aware of our care-partner policy and [id number] safety protocol.  The patient is COVID-19 vaccinated.

## 2021-08-26 ENCOUNTER — Encounter: Payer: Self-pay | Admitting: Gastroenterology

## 2021-09-02 ENCOUNTER — Encounter: Payer: Self-pay | Admitting: Gastroenterology

## 2021-09-02 ENCOUNTER — Ambulatory Visit (AMBULATORY_SURGERY_CENTER): Payer: PPO | Admitting: Gastroenterology

## 2021-09-02 VITALS — BP 98/68 | HR 68 | Temp 98.1°F | Resp 21 | Ht 74.0 in | Wt 193.0 lb

## 2021-09-02 DIAGNOSIS — Z1211 Encounter for screening for malignant neoplasm of colon: Secondary | ICD-10-CM

## 2021-09-02 DIAGNOSIS — D123 Benign neoplasm of transverse colon: Secondary | ICD-10-CM

## 2021-09-02 DIAGNOSIS — D122 Benign neoplasm of ascending colon: Secondary | ICD-10-CM | POA: Diagnosis not present

## 2021-09-02 DIAGNOSIS — J449 Chronic obstructive pulmonary disease, unspecified: Secondary | ICD-10-CM | POA: Diagnosis not present

## 2021-09-02 HISTORY — PX: COLONOSCOPY: SHX174

## 2021-09-02 MED ORDER — SODIUM CHLORIDE 0.9 % IV SOLN
500.0000 mL | Freq: Once | INTRAVENOUS | Status: DC
Start: 2021-09-02 — End: 2021-09-02

## 2021-09-02 NOTE — Progress Notes (Signed)
° °  Referring Provider: Marin Olp, MD Primary Care Physician:  Marin Olp, MD  Reason for Procedure:  Colon cancer screening   IMPRESSION:  Need for colon cancer screening Appropriate candidate for monitored anesthesia care  PLAN: Colonoscopy in the [place] today   HPI: Keith Curtis is a 71 y.o. male presents for screening colonoscopy.  No prior colonoscopy.  No baseline GI symptoms.   No known family history of colon cancer or polyps. No family history of uterine/endometrial cancer, pancreatic cancer or gastric/stomach cancer.   Past Medical History:  Diagnosis Date   Cancer ([place]) 2022   Melanoma-removed   COPD (chronic obstructive pulmonary disease) ([place])    Erectile dysfunction    buys sildenafil from [place] or other- 40 or 60mg    Genital herpes    last outbreak 2012 or before   Marijuana use    alomst on daily basis   Tobacco abuse    1.5 PPD. uses as a crutch. not ready to quit    Past Surgical History:  Procedure Laterality Date   APPENDECTOMY  1975   CYST EXCISION     left shoulder blade   hernia bilateral     inguinal- both sides- 3 surgeries total. around 2009 last surgery.     Current Outpatient Medications  Medication Sig Dispense Refill   albuterol (VENTOLIN HFA) 108 (90 Base) MCG/ACT inhaler Inhale 2 puffs into the lungs every 4 (four) hours as needed for wheezing or shortness of breath. 1 each 0   clobetasol cream (TEMOVATE) 0.05 % Apply topically.     sildenafil (VIAGRA) 50 MG tablet Take 50 mg by mouth daily as needed for erectile dysfunction.     triamcinolone cream (KENALOG) 0.1 % Apply 1 application topically 2 (two) times daily. For 7-10 days maximum 30 g 0   Current Facility-Administered Medications  Medication Dose Route Frequency Provider Last Rate Last Admin   0.9 %  sodium chloride infusion  500 mL Intravenous Once Thornton Park, MD        Allergies as of 09/02/2021   (No Known Allergies)    Family History   Problem Relation Age of Onset   Other Mother        died in 12s- unknown cause   Heart attack Father        died at age 61   Stroke Father        72   Colon polyps Maternal Uncle    Colon cancer Neg Hx    Esophageal cancer Neg Hx    Stomach cancer Neg Hx    Rectal cancer Neg Hx      Physical Exam: General:   Alert,  well-nourished, pleasant and cooperative in NAD Head:  Normocephalic and atraumatic. Eyes:  Sclera clear, no icterus.   Conjunctiva pink. Mouth:  No deformity or lesions.   Neck:  Supple; no masses or thyromegaly. Lungs:  Clear throughout to auscultation.   No wheezes. Heart:  Regular rate and rhythm; no murmurs. Abdomen:  Soft, non-tender, nondistended, normal bowel sounds, no rebound or guarding.  Msk:  Symmetrical. No boney deformities LAD: No inguinal or umbilical LAD Extremities:  No clubbing or edema. Neurologic:  Alert and  oriented x4;  grossly nonfocal Skin:  No obvious rash or bruise. Psych:  Alert and cooperative. Normal mood and affect.     Studies/Results: No results found.    Torry Istre L. Tarri Glenn, MD, MPH 09/02/2021, 12:56 PM

## 2021-09-02 NOTE — Patient Instructions (Signed)
Handouts given for Polyps and Diverticulosis and hemorrhoids.  Await pathology report from Dr. Tarri Glenn. High Fiber Diet.  Repeat Colonoscopy in 3-6 months.  Continue present medications.  YOU HAD AN ENDOSCOPIC PROCEDURE TODAY AT [facility]:   Refer to the procedure report that was given to you for any specific questions about what was found during the examination.  If the procedure report does not answer your questions, please call your gastroenterologist to clarify.  If you requested that your care partner not be given the details of your procedure findings, then the procedure report has been included in a sealed envelope for you to review at your convenience later.  YOU SHOULD EXPECT: Some feelings of bloating in the abdomen. Passage of more gas than usual.  Walking can help get rid of the air that was put into your GI tract during the procedure and reduce the bloating. If you had a lower endoscopy (such as a colonoscopy or flexible sigmoidoscopy) you may notice spotting of blood in your stool or on the toilet paper. If you underwent a bowel prep for your procedure, you may not have a normal bowel movement for a few days.  Please Note:  You might notice some irritation and congestion in your nose or some drainage.  This is from the oxygen used during your procedure.  There is no need for concern and it should clear up in a day or so.  SYMPTOMS TO REPORT IMMEDIATELY:  Following lower endoscopy (colonoscopy or flexible sigmoidoscopy):  Excessive amounts of blood in the stool  Significant tenderness or worsening of abdominal pains  Swelling of the abdomen that is new, acute  Fever of 100F or higher  For urgent or emergent issues, a gastroenterologist can be reached at any hour by calling [phone]. Do not use MyChart messaging for urgent concerns.    DIET:  We do recommend a small meal at first, but then you may proceed to your regular diet.  Drink plenty of fluids but you  should avoid alcoholic beverages for 24 hours.  ACTIVITY:  You should plan to take it easy for the rest of today and you should NOT DRIVE or use heavy machinery until tomorrow (because of the sedation medicines used during the test).    FOLLOW UP: Our staff will call the number listed on your records 48-72 hours following your procedure to check on you and address any questions or concerns that you may have regarding the information given to you following your procedure. If we do not reach you, we will leave a message.  We will attempt to reach you two times.  During this call, we will ask if you have developed any symptoms of COVID 19. If you develop any symptoms (ie: fever, flu-like symptoms, shortness of breath, cough etc.) before then, please call [phone].  If you test positive for Covid 19 in the 2 weeks post procedure, please call and report this information to [place].    If any biopsies were taken you will be contacted by phone or by letter within the next 1-3 weeks.  Please call us at [phone] if you have not heard about the biopsies in 3 weeks.    SIGNATURES/CONFIDENTIALITY: You and/or your care partner have signed paperwork which will be entered into your electronic medical record.  These signatures attest to the fact that that the information above on your After Visit Summary has been reviewed and is understood.  Full responsibility of the confidentiality of  this discharge information lies with you and/or your care-partner.

## 2021-09-02 NOTE — Progress Notes (Signed)
Report to PACU, RN, vss, BBS= Clear.  

## 2021-09-02 NOTE — Progress Notes (Signed)
Pt's states no medical or surgical changes since previsit or office visit.  ° °Vitals CW °

## 2021-09-02 NOTE — Progress Notes (Signed)
Called to room to assist during endoscopic procedure.  Patient ID and intended procedure confirmed with present staff. Received instructions for my participation in the procedure from the performing physician.  

## 2021-09-02 NOTE — Progress Notes (Signed)
3- 6 month recall to check polypectomy site.  Schedule on goes to the end of March.  3 month recall entered in pts chart.

## 2021-09-02 NOTE — Op Note (Signed)
[name] Patient Name: Keith Curtis Procedure Date: 09/02/2021 1:21 PM MRN: [id number] Endoscopist: Thornton Park MD, MD Age: 71 Referring MD:  Date of Birth: 1950/09/13 Gender: Male Account #: [id number] Procedure:                Colonoscopy Indications:              Screening for colorectal malignant neoplasm, This                            is the patient's first colonoscopy                           No known family history of colon cancer or polyps Medicines:                Monitored Anesthesia Care Procedure:                Pre-Anesthesia Assessment:                           - Prior to the procedure, a History and Physical                            was performed, and patient medications and                            allergies were reviewed. The patient's tolerance of                            previous anesthesia was also reviewed. The risks                            and benefits of the procedure and the sedation                            options and risks were discussed with the patient.                            All questions were answered, and informed consent                            was obtained. Prior Anticoagulants: The patient has                            taken no previous anticoagulant or antiplatelet                            agents. ASA Grade Assessment: II - A patient with                            mild systemic disease. After reviewing the risks                            and benefits, the patient was deemed in  satisfactory condition to undergo the procedure.                           After obtaining informed consent, the colonoscope                            was passed under direct vision. Throughout the                            procedure, the patient's blood pressure, pulse, and                            oxygen saturations were monitored continuously. The                            CF [id number] #[id number] was  introduced through the anus                            and advanced to the 3 cm into the ileum. A second                            forward view of the right colon was performed. The                            colonoscopy was performed without difficulty. The                            patient tolerated the procedure well. The quality                            of the bowel preparation was good. The terminal                            ileum, ileocecal valve, appendiceal orifice, and                            rectum were photographed. Scope In: 1:28:24 PM Scope Out: 1:55:57 PM Scope Withdrawal Time: 0 hours 25 minutes 9 seconds  Total Procedure Duration: 0 hours 27 minutes 33 seconds  Findings:                 The perianal and digital rectal examinations were                            normal.                           Non-bleeding internal hemorrhoids were found.                           A few small-mouthed diverticula were found in the                            sigmoid colon.  A 3 mm polyp was found in the transverse colon. The                            polyp was sessile. The polyp was removed with a                            cold snare. Resection and retrieval were complete.                            Estimated blood loss was minimal.                           A 20 mm polyp was found in the distal ascending                            colon. The polyp was dimpled and carpet-like,                            involving both sides of the colonic fold. Area was                            successfully injected with 5 mL saline for a lift                            polypectomy. The polyp was removed with a piecemeal                            technique using a hot snare. Resection was                            difficult as the snare repeatedly slid over any                            residual polyp. Area was tattooed with an injection                             of 5 mL of Spot (carbon black).                           Two sessile polyps were found in the proximal                            ascending colon. The polyps were 2 to 3 mm in size.                            These polyps were removed with a cold snare.                            Resection and retrieval were complete. Estimated                            blood loss was minimal.  The exam was otherwise without abnormality on                            direct and retroflexion views. Complications:            No immediate complications. Estimated blood loss:                            Minimal. Estimated Blood Loss:     Estimated blood loss was minimal. Impression:               - Non-bleeding internal hemorrhoids.                           - Diverticulosis in the sigmoid colon.                           - One 3 mm polyp in the transverse colon, removed                            with a cold snare. Resected and retrieved.                           - One 20 mm polyp in the distal ascending colon,                            removed piecemeal using a hot snare. Resected and                            retrieved. Injected. Tattooed.                           - Two 2 to 3 mm polyps in the proximal ascending                            colon, removed with a cold snare. Resected and                            retrieved.                           - The examination was otherwise normal on direct                            and retroflexion views. Recommendation:           - Patient has a contact number available for                            emergencies. The signs and symptoms of potential                            delayed complications were discussed with the                            patient. Return to normal activities  tomorrow.                            Written discharge instructions were provided to the                            patient.                           -  High fiber diet.                           - Continue present medications.                           - Await pathology results.                           - Repeat colonoscopy in 3-6 months to insure                            complete resection of the distal ascending colon                            polyp.                           - Emerging evidence supports eating a diet of                            fruits, vegetables, grains, calcium, and yogurt                            while reducing red meat and alcohol may reduce the                            risk of colon cancer.                           - Thank you for allowing me to be involved in your                            colon cancer prevention. Thornton Park MD, MD 09/02/2021 2:04:25 PM This report has been signed electronically.

## 2021-09-06 ENCOUNTER — Telehealth: Payer: Self-pay

## 2021-09-06 NOTE — Telephone Encounter (Signed)
°  Follow up Call-  Call back number 09/02/2021  Post procedure Call Back phone  # [phone]  Permission to leave phone message Yes  Some recent data might be hidden     Patient questions:  Do you have a fever, pain , or abdominal swelling? No. Pain Score  0 *  Have you tolerated food without any problems? Yes.    Have you been able to return to your normal activities? Yes.    Do you have any questions about your discharge instructions: Diet   No. Medications  No. Follow up visit  No.  Do you have questions or concerns about your Care? Yes.    Actions: * If pain score is 4 or above: No action needed, pain <4.  Pt asked if he had bx's done, let him know he had polyps removed and that Dr Tarri Glenn will either call him with the results in a week or 2 or he would get a letter.  Report states next colonoscopy will be 3-6 months, let him know the letter will clarify when he should have the colon done again, pt verb understanding  Have you developed a fever since your procedure? no  2.   Have you had an respiratory symptoms (SOB or cough) since your procedure? no  3.   Have you tested positive for COVID 19 since your procedure no  4.   Have you had any family members/close contacts diagnosed with the COVID 19 since your procedure?  no   If yes to any of these questions please route to Joylene John, RN and Joella Prince, RN

## 2021-09-06 NOTE — Telephone Encounter (Signed)
First post procedure follow up call, no answer 

## 2021-10-10 DIAGNOSIS — Z1283 Encounter for screening for malignant neoplasm of skin: Secondary | ICD-10-CM | POA: Diagnosis not present

## 2021-10-10 DIAGNOSIS — X32XXXD Exposure to sunlight, subsequent encounter: Secondary | ICD-10-CM | POA: Diagnosis not present

## 2021-10-10 DIAGNOSIS — L570 Actinic keratosis: Secondary | ICD-10-CM | POA: Diagnosis not present

## 2021-10-10 DIAGNOSIS — Z08 Encounter for follow-up examination after completed treatment for malignant neoplasm: Secondary | ICD-10-CM | POA: Diagnosis not present

## 2021-10-10 DIAGNOSIS — Z85820 Personal history of malignant melanoma of skin: Secondary | ICD-10-CM | POA: Diagnosis not present

## 2021-10-10 DIAGNOSIS — B078 Other viral warts: Secondary | ICD-10-CM | POA: Diagnosis not present

## 2021-10-10 DIAGNOSIS — C44519 Basal cell carcinoma of skin of other part of trunk: Secondary | ICD-10-CM | POA: Diagnosis not present

## 2021-10-14 NOTE — Progress Notes (Incomplete)
Phone: [phone]   Subjective:  Patient presents today for their annual physical. Chief complaint-noted.   See problem oriented charting- ROS- full  review of systems was completed and negative  except for: ***  The following were reviewed and entered/updated in epic: Past Medical History:  Diagnosis Date   Cancer ([place]) 2022   Melanoma-removed   COPD (chronic obstructive pulmonary disease) ([place])    Erectile dysfunction    buys sildenafil from [place] or other- 40 or 60mg    Genital herpes    last outbreak 2012 or before   Marijuana use    alomst on daily basis   Tobacco abuse    1.5 PPD. uses as a crutch. not ready to quit   Patient Active Problem List   Diagnosis Date Noted   Aortic atherosclerosis ([place]) 04/30/2021   COPD (chronic obstructive pulmonary disease) ([place]) 04/25/2021   Varicose veins of both lower extremities 10/15/2018   Hypertriglyceridemia 03/25/2018   Knee osteoarthritis 09/01/2016   Lipoma of left shoulder 09/01/2016   Tobacco dependence    Marijuana use    Genital herpes    Erectile dysfunction    Past Surgical History:  Procedure Laterality Date   APPENDECTOMY  1975   COLONOSCOPY  09/02/2021   CYST EXCISION     left shoulder blade   hernia bilateral     inguinal- both sides- 3 surgeries total. around 2009 last surgery.     Family History  Problem Relation Age of Onset   Other Mother        died in 70s- unknown cause   Heart attack Father        died at age 59   Stroke Father        54   Colon polyps Maternal Uncle    Colon cancer Neg Hx    Esophageal cancer Neg Hx    Stomach cancer Neg Hx    Rectal cancer Neg Hx     Medications- reviewed and updated Current Outpatient Medications  Medication Sig Dispense Refill   albuterol (VENTOLIN HFA) 108 (90 Base) MCG/ACT inhaler Inhale 2 puffs into the lungs every 4 (four) hours as needed for wheezing or shortness of breath. 1 each 0   clobetasol cream (TEMOVATE) 0.05 % Apply topically.      sildenafil (VIAGRA) 50 MG tablet Take 50 mg by mouth daily as needed for erectile dysfunction. (Patient not taking: Reported on 09/02/2021)     triamcinolone cream (KENALOG) 0.1 % Apply 1 application topically 2 (two) times daily. For 7-10 days maximum 30 g 0   No current facility-administered medications for this visit.    Allergies-reviewed and updated No Known Allergies  Social History   Social History Narrative   Divorced. GF 10 years in 2018. Son from prior marriage. No grandkids.       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       HObbies: time with gf, playing guitar, time in [place] with GF. Enjoys hiking.    Objective  Objective:  There were no vitals taken for this visit. Gen: NAD, resting comfortably HEENT: Mucous membranes are moist. Oropharynx normal Neck: no thyromegaly CV: RRR no murmurs rubs or gallops Lungs: CTAB no crackles, wheeze, rhonchi Abdomen: soft/nontender/nondistended/normal bowel sounds. No rebound or guarding.  Ext: no edema Skin: warm, dry Neuro: grossly normal, moves all extremities, PERRLA ***   Assessment and Plan  71 y.o. male presenting  for annual physical.  Health Maintenance counseling: 1. Anticipatory guidance: Patient counseled regarding regular dental exams ***q6 months, eye exams ***yearly,  avoiding smoking and second hand smoke*** , limiting alcohol to 2 beverages per day ***. No illicit drugs - ***  2. Risk factor reduction:  Advised patient of need for regular exercise and diet rich and fruits and vegetables to reduce risk of heart attack and stroke.  Exercise- ***.  Diet/weight management-***.  Wt Readings from Last 3 Encounters:  09/02/21 193 lb (87.5 kg)  08/19/21 193 lb (87.5 kg)  04/25/21 193 lb 12.8 oz (87.9 kg)   3. Immunizations/screenings/ancillary studies DISCUSSED:  -Prevnar-20 vaccination #1 - *** Immunization History  Administered Date(s)  Administered   Moderna Sars-Covid-2 Vaccination 06/15/2020, 07/13/2020   Health Maintenance Due  Topic Date Due   Pneumonia Vaccine 49+ Years old (1 - PCV) Never done   4. Prostate cancer screening- ***  Lab Results  Component Value Date   PSA 3.05 04/28/2021   5. Colon cancer screening - 09/02/21 with 10 year repeat planned*** 6. Skin cancer screening- ***advised regular sunscreen use. Denies worrisome, changing, or new skin lesions.  7. Smoking associated screening (lung cancer screening, AAA screen 65-75, UA)- *** smoker- *** 8. STD screening - ***  Status of chronic or acute concerns   *** discuss aortic atherosclerosis next visit after 04/2021- mentioned on ychart note  *** Thornton Park, MD  09/09/2021  9:50 AM EST   As expected, pathology results show tubular adenomas. Given the piecemeal resection of the largest polyp, repeat colonoscopy was recommended in 3-6 months. These recommendations have not changed after reviewing the pathology results. Thanks.    # Skin issues S: Patient with 3 skin lesions he was concerned about - area on left forehead and right neck appeared to be seborrheic keratosis but wanted a dermatologist opinion.  A/P: ***   #hyperlipidemia/hypertriglyceridemia S: Medication: None Lab Results  Component Value Date   CHOL 161 04/28/2021   HDL 61.70 04/28/2021   LDLCALC 73 04/28/2021   TRIG 134.0 04/28/2021   CHOLHDL 3 04/28/2021   A/P: ***  #Screening for prostate cancer-no PSAs on file-discussed getting a check this year and perhaps next year Lab Results  Component Value Date   PSA 3.05 04/28/2021    #Screening for colon cancer-discussed colonoscopy was Cologuard-patient reported he will try colonoscopy after counseling    # ED visit for SOB- found to have COPD S:Patient was seen on 10/29/2020 at [facility] ED with Dr.Mesner for shortness of breath.  Patient was thought to have potential underlying COPD-treated for bronchitis  with-medications ordered: albuterol hfa 108 mcg, Prednisone 60 mg and Augmentin 875-125 mg.  He also responded well to nebulizer in the emergency room   - Did had some underlying SOB- albuterol was helpful some with this -referred to pulmonology for PFTs/work up- he declined A/P: ***  #Tobacco dependence S:1.5 packs/day.  Also uses marijuana on a daily basis  -cessation-patient not ready to quit  -Discussed lung cancer screening program again -patient declines  -Discussed AAA scan/screening- opts in  A/P: ***   # Gross hematuria S: Patient with gross hematuria at time of last visit in 2020 and concerning with 45-pack-year smoking history-he was referred to urology-I was not able to locate any records from his visit with them-asked patient he reports he never went and forgot about this.  We also recommended physical in 4 to 5 months but unfortunately he did not have a chance  to  follow-up until over 2 years later-which was 04/2021. No blood in urine since that time A/P: ***   # Erectile Dysfunction S:Viagra 50 mg daily as needed- helpful- had noted lower libido A/P: ***   # history of he reported psoriasis- appears to be more eczema on exam today- will refill triamcinolone for prn use  Recommended follow up: No follow-ups on file. Future Appointments  Date Time Provider [place]  10/25/2021  2:40 PM Marin Olp, MD [facility]  11/16/2021 10:00 AM [facility] PREVISIT RM 50 [facility]  12/08/2021  1:30 PM Thornton Park, MD [facility]    No chief complaint on file.  Lab/Order associations:*** fasting No diagnosis found.  No orders of the defined types were placed in this encounter.   I,Jada Bradford,acting as a scribe for Garret Reddish, MD.,have documented all relevant documentation on the behalf of Garret Reddish, MD,as directed by  Garret Reddish, MD while in the presence of Garret Reddish, MD.  *** Return precautions advised.  Burnett Corrente

## 2021-10-25 ENCOUNTER — Ambulatory Visit (INDEPENDENT_AMBULATORY_CARE_PROVIDER_SITE_OTHER): Payer: PPO | Admitting: Family Medicine

## 2021-10-25 ENCOUNTER — Encounter: Payer: Self-pay | Admitting: Family Medicine

## 2021-10-25 VITALS — BP 104/64 | HR 56 | Temp 97.2°F | Ht 74.0 in | Wt 205.4 lb

## 2021-10-25 DIAGNOSIS — Z0000 Encounter for general adult medical examination without abnormal findings: Secondary | ICD-10-CM | POA: Diagnosis not present

## 2021-10-25 DIAGNOSIS — Z85820 Personal history of malignant melanoma of skin: Secondary | ICD-10-CM | POA: Diagnosis not present

## 2021-10-25 DIAGNOSIS — I700 Atherosclerosis of aorta: Secondary | ICD-10-CM | POA: Diagnosis not present

## 2021-10-25 DIAGNOSIS — R351 Nocturia: Secondary | ICD-10-CM

## 2021-10-25 DIAGNOSIS — J449 Chronic obstructive pulmonary disease, unspecified: Secondary | ICD-10-CM | POA: Diagnosis not present

## 2021-10-25 DIAGNOSIS — F17200 Nicotine dependence, unspecified, uncomplicated: Secondary | ICD-10-CM | POA: Diagnosis not present

## 2021-10-25 DIAGNOSIS — E781 Pure hyperglyceridemia: Secondary | ICD-10-CM

## 2021-10-25 DIAGNOSIS — R310 Gross hematuria: Secondary | ICD-10-CM

## 2021-10-25 DIAGNOSIS — E785 Hyperlipidemia, unspecified: Secondary | ICD-10-CM

## 2021-10-25 MED ORDER — ROSUVASTATIN CALCIUM 10 MG PO TABS: 10.0000 mg | ORAL_TABLET | ORAL | 3 refills | Status: DC

## 2021-10-25 NOTE — Patient Instructions (Addendum)
Please stop by lab before you go ?If you have mychart- we will send your results within 3 business days of [place] receiving them.  ?If you do not have mychart- we will call you about results within 5 business days of [place] receiving them.  ?*please also note that you will see labs on mychart as soon as they post. I will later go in and write notes on them- will say "notes from Dr. Yong Channel"  ? ?Trial rosuvastatin 10 mg once a week to reduce cholesterol levels  ? ?Would love to see you quit smoking- one of the best things you can do for your health ? ?Thanks for seeing all the doctors we tried to set you up with- dermatology, gastroenterology- these were likely life saving measures on your part ? ?Recommended follow up: Return in about 1 year (around 10/26/2022) for physical or sooner if needed. ?

## 2021-10-26 ENCOUNTER — Telehealth: Payer: Self-pay | Admitting: Family Medicine

## 2021-10-26 LAB — PSA: PSA: 5.97 ng/mL — ABNORMAL HIGH (ref 0.10–4.00)

## 2021-10-26 LAB — CBC WITH DIFFERENTIAL/PLATELET
Basophils Absolute: 0.1 10*3/uL (ref 0.0–0.1)
Basophils Relative: 1 % (ref 0.0–3.0)
Eosinophils Absolute: 0.5 10*3/uL (ref 0.0–0.7)
Eosinophils Relative: 7.4 % — ABNORMAL HIGH (ref 0.0–5.0)
HCT: 51.6 % (ref 39.0–52.0)
Hemoglobin: 16.7 g/dL (ref 13.0–17.0)
Lymphocytes Relative: 28.6 % (ref 12.0–46.0)
Lymphs Abs: 1.8 10*3/uL (ref 0.7–4.0)
MCHC: 32.4 g/dL (ref 30.0–36.0)
MCV: 93.5 fl (ref 78.0–100.0)
Monocytes Absolute: 0.7 10*3/uL (ref 0.1–1.0)
Monocytes Relative: 11.7 % (ref 3.0–12.0)
Neutro Abs: 3.2 10*3/uL (ref 1.4–7.7)
Neutrophils Relative %: 51.3 % (ref 43.0–77.0)
Platelets: 244 10*3/uL (ref 150.0–400.0)
RBC: 5.51 Mil/uL (ref 4.22–5.81)
RDW: 14.3 % (ref 11.5–15.5)
WBC: 6.2 10*3/uL (ref 4.0–10.5)

## 2021-10-26 LAB — COMPREHENSIVE METABOLIC PANEL
ALT: 14 U/L (ref 0–53)
AST: 15 U/L (ref 0–37)
Albumin: 4.2 g/dL (ref 3.5–5.2)
Alkaline Phosphatase: 73 U/L (ref 39–117)
BUN: 22 mg/dL (ref 6–23)
CO2: 32 mEq/L (ref 19–32)
Calcium: 9.4 mg/dL (ref 8.4–10.5)
Chloride: 101 mEq/L (ref 96–112)
Creatinine, Ser: 1.05 mg/dL (ref 0.40–1.50)
GFR: 71.51 mL/min (ref >60.00)
Glucose, Bld: 79 mg/dL (ref 70–99)
Potassium: 5.7 mEq/L — ABNORMAL HIGH (ref 3.5–5.1)
Sodium: 138 mEq/L (ref 135–145)
Total Bilirubin: 0.4 mg/dL (ref 0.2–1.2)
Total Protein: 6.4 g/dL (ref 6.0–8.3)

## 2021-10-26 NOTE — Telephone Encounter (Signed)
Patient called back in regards to labs.  ?

## 2021-10-27 ENCOUNTER — Other Ambulatory Visit: Payer: PPO

## 2021-10-27 ENCOUNTER — Other Ambulatory Visit: Payer: Self-pay

## 2021-10-27 NOTE — Telephone Encounter (Signed)
Called and spoke with pt and labs reviewed. 

## 2021-10-31 ENCOUNTER — Ambulatory Visit: Payer: PPO | Admitting: Dermatology

## 2021-11-16 ENCOUNTER — Ambulatory Visit (AMBULATORY_SURGERY_CENTER): Payer: PPO | Admitting: *Deleted

## 2021-11-16 VITALS — Ht 74.0 in | Wt 200.0 lb

## 2021-11-16 DIAGNOSIS — Z86010 Personal history of colonic polyps: Secondary | ICD-10-CM

## 2021-11-16 MED ORDER — NA SULFATE-K SULFATE-MG SULF 17.5-3.13-1.6 GM/177ML PO SOLN: 1.0000 | Freq: Once | ORAL | 0 refills | Status: AC

## 2021-11-16 NOTE — Progress Notes (Signed)

## 2021-11-29 ENCOUNTER — Encounter: Payer: Self-pay | Admitting: Gastroenterology

## 2021-11-30 ENCOUNTER — Encounter: Payer: Self-pay | Admitting: Certified Registered Nurse Anesthetist

## 2021-12-05 DIAGNOSIS — Z08 Encounter for follow-up examination after completed treatment for malignant neoplasm: Secondary | ICD-10-CM | POA: Diagnosis not present

## 2021-12-05 DIAGNOSIS — X32XXXD Exposure to sunlight, subsequent encounter: Secondary | ICD-10-CM | POA: Diagnosis not present

## 2021-12-05 DIAGNOSIS — Z85828 Personal history of other malignant neoplasm of skin: Secondary | ICD-10-CM | POA: Diagnosis not present

## 2021-12-05 DIAGNOSIS — Z1283 Encounter for screening for malignant neoplasm of skin: Secondary | ICD-10-CM | POA: Diagnosis not present

## 2021-12-05 DIAGNOSIS — Z85820 Personal history of malignant melanoma of skin: Secondary | ICD-10-CM | POA: Diagnosis not present

## 2021-12-05 DIAGNOSIS — L570 Actinic keratosis: Secondary | ICD-10-CM | POA: Diagnosis not present

## 2021-12-05 DIAGNOSIS — B078 Other viral warts: Secondary | ICD-10-CM | POA: Diagnosis not present

## 2021-12-07 ENCOUNTER — Encounter: Payer: Self-pay | Admitting: Certified Registered Nurse Anesthetist

## 2021-12-07 ENCOUNTER — Encounter: Payer: PPO | Admitting: Gastroenterology

## 2021-12-08 ENCOUNTER — Ambulatory Visit (AMBULATORY_SURGERY_CENTER): Payer: PPO | Admitting: Gastroenterology

## 2021-12-08 ENCOUNTER — Encounter: Payer: Self-pay | Admitting: Gastroenterology

## 2021-12-08 VITALS — BP 112/71 | HR 74 | Temp 96.9°F | Resp 27 | Ht 74.0 in | Wt 200.0 lb

## 2021-12-08 DIAGNOSIS — Z86010 Personal history of colonic polyps: Secondary | ICD-10-CM

## 2021-12-08 DIAGNOSIS — J449 Chronic obstructive pulmonary disease, unspecified: Secondary | ICD-10-CM | POA: Diagnosis not present

## 2021-12-08 DIAGNOSIS — D122 Benign neoplasm of ascending colon: Secondary | ICD-10-CM | POA: Diagnosis not present

## 2021-12-08 MED ORDER — SODIUM CHLORIDE 0.9 % IV SOLN: 500.0000 mL | Freq: Once | INTRAVENOUS | Status: DC

## 2021-12-08 NOTE — Progress Notes (Signed)
Called to room to assist during endoscopic procedure.  Patient ID and intended procedure confirmed with present staff. Received instructions for my participation in the procedure from the performing physician.  

## 2021-12-08 NOTE — Progress Notes (Signed)
Report given to PACU, vss 

## 2021-12-08 NOTE — Progress Notes (Signed)
Pt's states no medical or surgical changes since previsit or office visit. VS assessed by D.T 

## 2021-12-08 NOTE — Op Note (Signed)
[facility] ?Patient Name: Keith Curtis ?Procedure Date: 12/08/2021 1:20 PM ?MRN: [id number] ?Endoscopist: Thornton Park MD, MD ?Age: 71 ?Referring MD:  ?Date of Birth: [birth year] ?Gender: Male ?Account #: [id number] ?Procedure:                Colonoscopy ?Indications:              High risk colon cancer surveillance: Personal  ?                          history of adenoma (10 mm or greater in size) ?                          4 tubular adenomas removed on colonoscopy 09/02/21  ?                          including a 75m distal ascending colon polyp  ?                          removed in pieces. ?Medicines:                Monitored Anesthesia Care ?Procedure:                Pre-Anesthesia Assessment: ?                          - Prior to the procedure, a History and Physical  ?                          was performed, and patient medications and  ?                          allergies were reviewed. The patient's tolerance of  ?                          previous anesthesia was also reviewed. The risks  ?                          and benefits of the procedure and the sedation  ?                          options and risks were discussed with the patient.  ?                          All questions were answered, and informed consent  ?                          was obtained. Prior Anticoagulants: The patient has  ?                          taken no previous anticoagulant or antiplatelet  ?                          agents. ASA Grade Assessment: III - A patient with  ?  severe systemic disease. After reviewing the risks  ?                          and benefits, the patient was deemed in  ?                          satisfactory condition to undergo the procedure. ?                          After obtaining informed consent, the colonoscope  ?                          was passed under direct vision. Throughout the  ?                          procedure, the patient's blood pressure, pulse, and  ?                           oxygen saturations were monitored continuously. The  ?                          CF [id number] #[id number] was introduced through the anus  ?                          and advanced to the 3 cm into the ileum. A second  ?                          forward view of the right colon was performed. The  ?                          colonoscopy was performed without difficulty. The  ?                          patient tolerated the procedure well. The quality  ?                          of the bowel preparation was good. The terminal  ?                          ileum, ileocecal valve, appendiceal orifice, and  ?                          rectum were photographed. ?Scope In: 1:26:10 PM ?Scope Out: 1:41:11 PM ?Scope Withdrawal Time: 0 hours 13 minutes 30 seconds  ?Total Procedure Duration: 0 hours 15 minutes 1 second  ?Findings:                 The perianal and digital rectal examinations were  ?                          normal. ?                          Non-bleeding internal hemorrhoids were found. ?  A few small and large-mouthed diverticula were  ?                          found in the sigmoid colon. ?                          A 1 mm polyp was found in the ascending colon. The  ?                          polyp was sessile. The polyp was removed with a  ?                          cold snare. Resection and retrieval were complete.  ?                          Estimated blood loss was minimal. ?                          The exam was otherwise without abnormality on  ?                          direct and retroflexion views. ?Complications:            No immediate complications. ?Estimated Blood Loss:     Estimated blood loss was minimal. ?Impression:               - Non-bleeding internal hemorrhoids. ?                          - Diverticulosis in the sigmoid colon. ?                          - One 1 mm polyp in the ascending colon, removed  ?                          with a cold snare.  Resected and retrieved. ?                          - The examination was otherwise normal on direct  ?                          and retroflexion views. ?Recommendation:           - Patient has a contact number available for  ?                          emergencies. The signs and symptoms of potential  ?                          delayed complications were discussed with the  ?                          patient. Return to normal activities tomorrow.  ?  Written discharge instructions were provided to the  ?                          patient. ?                          - High fiber diet. ?                          - Continue present medications. ?                          - Await pathology results. ?                          - Repeat colonoscopy in 3 years for surveillance,  ?                          earlier with new symptoms. ?                          - Follow a high fiber diet. Drink at least 64  ?                          ounces of water daily. Add a daily stool bulking  ?                          agent such as psyllium (an exampled would be  ?                          Metamucil). ?                          - Emerging evidence supports eating a diet of  ?                          fruits, vegetables, grains, calcium, and yogurt  ?                          while reducing red meat and alcohol may reduce the  ?                          risk of colon cancer. ?                          - Thank you for allowing me to be involved in your  ?                          colon cancer prevention. ?Thornton Park MD, MD ?12/08/2021 1:46:29 PM ?This report has been signed electronically. ?

## 2021-12-08 NOTE — Progress Notes (Signed)
? ?Referring Provider: Marin Olp, MD ?Primary Care Physician:  Marin Olp, MD ? ?Reason for Procedure:  Polyp surveillance ? ? ?IMPRESSION:  ?History of large tubular adenoma removed in a piecemeal fashion 08/2021 ?Appropriate candidate for monitored anesthesia care ? ?PLAN: ?Colonoscopy in the [place] today ? ? ?HPI: Keith Curtis is a 71 y.o. male presents for screening colonoscopy. ? ?Colonoscopy 09/02/21.  ?- Non-bleeding internal hemorrhoids. ?- Diverticulosis in the sigmoid colon. ?- One 3 mm polyp in the transverse colon, removed with a cold snare. Resected and ?retrieved. ?- One 20 mm polyp in the distal ascending colon, removed piecemeal using a hot snare. ?Resected and retrieved. Injected. Tattooed. ?- Two 2 to 3 mm polyps in the proximal ascending colon, removed with a cold snare. ?Resected and retrieved. ?- The examination was otherwise normal on direct and retroflexion views. ? ?All polyps were tubular adenomas.  ? ?Surveillance recommended in 3-6 months.  ? ? ? ?Past Medical History:  ?Diagnosis Date  ? Adenomatous polyps   ? Cancer [facility]) 2022  ? Melanoma-removed  ? COPD (chronic obstructive pulmonary disease) ([place])   ? Erectile dysfunction   ? buys sildenafil from [place] or other- 40 or '60mg'$   ? Genital herpes   ? last outbreak 2012 or before  ? Hyperlipidemia   ? ON ROSUVASTATIN  ? Marijuana use   ? alomst on daily basis  ? Tobacco abuse   ? 1.5 PPD. uses as a crutch. not ready to quit  ? ? ?Past Surgical History:  ?Procedure Laterality Date  ? APPENDECTOMY  1975  ? COLONOSCOPY  09/02/2021  ? CYST EXCISION    ? left shoulder blade  ? hernia bilateral    ? inguinal- both sides- 3 surgeries total. around 2009 last surgery.   ? POLYPECTOMY    ? TA + 09-02-2021  ? ? ?Current Outpatient Medications  ?Medication Sig Dispense Refill  ? albuterol (VENTOLIN HFA) 108 (90 Base) MCG/ACT inhaler Inhale 2 puffs into the lungs every 4 (four) hours as needed for wheezing or shortness of breath. 1 each 0  ?  clobetasol cream (TEMOVATE) 0.05 % Apply topically.    ? rosuvastatin (CRESTOR) 10 MG tablet Take 1 tablet (10 mg total) by mouth once a week. 13 tablet 3  ? sildenafil (VIAGRA) 50 MG tablet Take 50 mg by mouth daily as needed for erectile dysfunction.    ? triamcinolone cream (KENALOG) 0.1 % Apply 1 application topically 2 (two) times daily. For 7-10 days maximum 30 g 0  ? ?No current facility-administered medications for this visit.  ? ? ?Allergies as of 12/08/2021  ? (No Known Allergies)  ? ? ?Family History  ?Problem Relation Age of Onset  ? Other Mother   ?     died in 33s- unknown cause  ? Heart attack Father   ?     died at age 11  ? Stroke Father   ?     110  ? Colon polyps Maternal Uncle   ? Colon cancer Neg Hx   ? Esophageal cancer Neg Hx   ? Stomach cancer Neg Hx   ? Rectal cancer Neg Hx   ? ? ? ?Physical Exam: ?General:   Alert,  well-nourished, pleasant and cooperative in NAD ?Head:  Normocephalic and atraumatic. ?Eyes:  Sclera clear, no icterus.   Conjunctiva pink. ?Mouth:  No deformity or lesions.   ?Neck:  Supple; no masses or thyromegaly. ?Lungs:  Clear throughout to auscultation.  No wheezes. ?Heart:  Regular rate and rhythm; no murmurs. ?Abdomen:  Soft, non-tender, nondistended, normal bowel sounds, no rebound or guarding.  ?Msk:  Symmetrical. No boney deformities ?LAD: No inguinal or umbilical LAD ?Extremities:  No clubbing or edema. ?Neurologic:  Alert and  oriented x4;  grossly nonfocal ?Skin:  No obvious rash or bruise. ?Psych:  Alert and cooperative. Normal mood and affect. ? ? ? ? ?Studies/Results: ?No results found. ? ? ? ?[name], MD, MPH ?12/08/2021, 1:05 PM ? ? ? ?  ?

## 2021-12-08 NOTE — Patient Instructions (Signed)
Handouts on polyps, hemorrhoids, and polyps given to patient.  ?Await pathology results. ?Resume previous diet and continue present medications. ?Repeat colonoscopy in 3 years for surveillance, earlier if new symptoms. ?Recommended to follow a high fiber diet and to drink at least 64 ounces of water daily & add a daily stool bulking agent such a psyllium (example - Metamucil) ? ? ? ?YOU HAD AN ENDOSCOPIC PROCEDURE TODAY AT [facility]:   Refer to the procedure report that was given to you for any specific questions about what was found during the examination.  If the procedure report does not answer your questions, please call your gastroenterologist to clarify.  If you requested that your care partner not be given the details of your procedure findings, then the procedure report has been included in a sealed envelope for you to review at your convenience later. ? ?YOU SHOULD EXPECT: Some feelings of bloating in the abdomen. Passage of more gas than usual.  Walking can help get rid of the air that was put into your GI tract during the procedure and reduce the bloating. If you had a lower endoscopy (such as a colonoscopy or flexible sigmoidoscopy) you may notice spotting of blood in your stool or on the toilet paper. If you underwent a bowel prep for your procedure, you may not have a normal bowel movement for a few days. ? ?Please Note:  You might notice some irritation and congestion in your nose or some drainage.  This is from the oxygen used during your procedure.  There is no need for concern and it should clear up in a day or so. ? ?SYMPTOMS TO REPORT IMMEDIATELY: ? ?Following lower endoscopy (colonoscopy or flexible sigmoidoscopy): ? Excessive amounts of blood in the stool ? Significant tenderness or worsening of abdominal pains ? Swelling of the abdomen that is new, acute ? Fever of 100?F or higher ? ?For urgent or emergent issues, a gastroenterologist can be reached at any hour by calling  [phone]. ?Do not use MyChart messaging for urgent concerns.  ? ? ?DIET:  We do recommend a small meal at first, but then you may proceed to your regular diet.  Drink plenty of fluids but you should avoid alcoholic beverages for 24 hours. ? ?ACTIVITY:  You should plan to take it easy for the rest of today and you should NOT DRIVE or use heavy machinery until tomorrow (because of the sedation medicines used during the test).   ? ?FOLLOW UP: ?Our staff will call the number listed on your records 48-72 hours following your procedure to check on you and address any questions or concerns that you may have regarding the information given to you following your procedure. If we do not reach you, we will leave a message.  We will attempt to reach you two times.  During this call, we will ask if you have developed any symptoms of COVID 19. If you develop any symptoms (ie: fever, flu-like symptoms, shortness of breath, cough etc.) before then, please call [phone].  If you test positive for Covid 19 in the 2 weeks post procedure, please call and report this information to [place].   ? ?If any biopsies were taken you will be contacted by phone or by letter within the next 1-3 weeks.  Please call us at [phone] if you have not heard about the biopsies in 3 weeks.  ? ? ?SIGNATURES/CONFIDENTIALITY: ?You and/or your care partner have signed paperwork which will be entered  into your electronic medical record.  These signatures attest to the fact that that the information above on your After Visit Summary has been reviewed and is understood.  Full responsibility of the confidentiality of this discharge information lies with you and/or your care-partner.  ?

## 2021-12-08 NOTE — Progress Notes (Signed)
1328 Ephedrine 10 mg given IV due to low BP, MD updated.   ?

## 2021-12-12 ENCOUNTER — Telehealth: Payer: Self-pay | Admitting: *Deleted

## 2021-12-12 NOTE — Telephone Encounter (Signed)
?  Follow up Call- ? ? ?  12/08/2021  ?  1:06 PM 09/02/2021  ? 12:46 PM  ?Call back number  ?Post procedure Call Back phone  # [phone]  ?Permission to leave phone message Yes Yes  ?  ?No answer at 2nd attempt follow up phone call.  Left message on voicemail.   ?

## 2021-12-13 ENCOUNTER — Encounter: Payer: Self-pay | Admitting: Gastroenterology

## 2021-12-29 ENCOUNTER — Ambulatory Visit (INDEPENDENT_AMBULATORY_CARE_PROVIDER_SITE_OTHER): Payer: PPO

## 2021-12-29 DIAGNOSIS — Z0000 Encounter for general adult medical examination without abnormal findings: Secondary | ICD-10-CM | POA: Diagnosis not present

## 2021-12-29 NOTE — Patient Instructions (Signed)
Keith Curtis , Thank you for taking time to come for your Medicare Wellness Visit. I appreciate your ongoing commitment to your health goals. Please review the following plan we discussed and let me know if I can assist you in the future.   Screening recommendations/referrals: Colonoscopy: Done 12/08/21 repeat every 3 years  Recommended yearly ophthalmology/optometry visit for glaucoma screening and checkup Recommended yearly dental visit for hygiene and checkup  Vaccinations: Influenza vaccine: Due  Pneumococcal vaccine: Due  Tdap vaccine: Discontinued  Shingles vaccine: Shingrix discussed. Please contact your pharmacy for coverage information.    Covid-19: Completed 11/2, 07/13/20  Advanced directives: Please bring a copy of your health care power of attorney and living will to the office at your convenience.  Conditions/risks identified: None at this time   Next appointment: Follow up in one year for your annual wellness visit.   Preventive Care 40 Years and Older, Male Preventive care refers to lifestyle choices and visits with your health care provider that can promote health and wellness. What does preventive care include? A yearly physical exam. This is also called an annual well check. Dental exams once or twice a year. Routine eye exams. Ask your health care provider how often you should have your eyes checked. Personal lifestyle choices, including: Daily care of your teeth and gums. Regular physical activity. Eating a healthy diet. Avoiding tobacco and drug use. Limiting alcohol use. Practicing safe sex. Taking low doses of aspirin every day. Taking vitamin and mineral supplements as recommended by your health care provider. What happens during an annual well check? The services and screenings done by your health care provider during your annual well check will depend on your age, overall health, lifestyle risk factors, and family history of disease. Counseling  Your  health care provider may ask you questions about your: Alcohol use. Tobacco use. Drug use. Emotional well-being. Home and relationship well-being. Sexual activity. Eating habits. History of falls. Memory and ability to understand (cognition). Work and work Statistician. Screening  You may have the following tests or measurements: Height, weight, and BMI. Blood pressure. Lipid and cholesterol levels. These may be checked every 5 years, or more frequently if you are over 72 years old. Skin check. Lung cancer screening. You may have this screening every year starting at age 5 if you have a 30-pack-year history of smoking and currently smoke or have quit within the past 15 years. Fecal occult blood test (FOBT) of the stool. You may have this test every year starting at age 38. Flexible sigmoidoscopy or colonoscopy. You may have a sigmoidoscopy every 5 years or a colonoscopy every 10 years starting at age 57. Prostate cancer screening. Recommendations will vary depending on your family history and other risks. Hepatitis C blood test. Hepatitis B blood test. Sexually transmitted disease (STD) testing. Diabetes screening. This is done by checking your blood sugar (glucose) after you have not eaten for a while (fasting). You may have this done every 1-3 years. Abdominal aortic aneurysm (AAA) screening. You may need this if you are a current or former smoker. Osteoporosis. You may be screened starting at age 85 if you are at high risk. Talk with your health care provider about your test results, treatment options, and if necessary, the need for more tests. Vaccines  Your health care provider may recommend certain vaccines, such as: Influenza vaccine. This is recommended every year. Tetanus, diphtheria, and acellular pertussis (Tdap, Td) vaccine. You may need a Td booster every 10 years. Zoster  vaccine. You may need this after age 75. Pneumococcal 13-valent conjugate (PCV13) vaccine. One dose  is recommended after age 68. Pneumococcal polysaccharide (PPSV23) vaccine. One dose is recommended after age 63. Talk to your health care provider about which screenings and vaccines you need and how often you need them. This information is not intended to replace advice given to you by your health care provider. Make sure you discuss any questions you have with your health care provider. Document Released: 08/27/2015 Document Revised: 04/19/2016 Document Reviewed: 06/01/2015 Elsevier Interactive Patient Education  2017 [employer] Prevention in the Home Falls can cause injuries. They can happen to people of all ages. There are many things you can do to make your home safe and to help prevent falls. What can I do on the outside of my home? Regularly fix the edges of walkways and driveways and fix any cracks. Remove anything that might make you trip as you walk through a door, such as a raised step or threshold. Trim any bushes or trees on the path to your home. Use bright outdoor lighting. Clear any walking paths of anything that might make someone trip, such as rocks or tools. Regularly check to see if handrails are loose or broken. Make sure that both sides of any steps have handrails. Any raised decks and porches should have guardrails on the edges. Have any leaves, snow, or ice cleared regularly. Use sand or salt on walking paths during winter. Clean up any spills in your garage right away. This includes oil or grease spills. What can I do in the bathroom? Use night lights. Install grab bars by the toilet and in the tub and shower. Do not use towel bars as grab bars. Use non-skid mats or decals in the tub or shower. If you need to sit down in the shower, use a plastic, non-slip stool. Keep the floor dry. Clean up any water that spills on the floor as soon as it happens. Remove soap buildup in the tub or shower regularly. Attach bath mats securely with double-sided non-slip rug  tape. Do not have throw rugs and other things on the floor that can make you trip. What can I do in the bedroom? Use night lights. Make sure that you have a light by your bed that is easy to reach. Do not use any sheets or blankets that are too big for your bed. They should not hang down onto the floor. Have a firm chair that has side arms. You can use this for support while you get dressed. Do not have throw rugs and other things on the floor that can make you trip. What can I do in the kitchen? Clean up any spills right away. Avoid walking on wet floors. Keep items that you use a lot in easy-to-reach places. If you need to reach something above you, use a strong step stool that has a grab bar. Keep electrical cords out of the way. Do not use floor polish or wax that makes floors slippery. If you must use wax, use non-skid floor wax. Do not have throw rugs and other things on the floor that can make you trip. What can I do with my stairs? Do not leave any items on the stairs. Make sure that there are handrails on both sides of the stairs and use them. Fix handrails that are broken or loose. Make sure that handrails are as long as the stairways. Check any carpeting to make sure that it  is firmly attached to the stairs. Fix any carpet that is loose or worn. Avoid having throw rugs at the top or bottom of the stairs. If you do have throw rugs, attach them to the floor with carpet tape. Make sure that you have a light switch at the top of the stairs and the bottom of the stairs. If you do not have them, ask someone to add them for you. What else can I do to help prevent falls? Wear shoes that: Do not have high heels. Have rubber bottoms. Are comfortable and fit you well. Are closed at the toe. Do not wear sandals. If you use a stepladder: Make sure that it is fully opened. Do not climb a closed stepladder. Make sure that both sides of the stepladder are locked into place. Ask someone to  hold it for you, if possible. Clearly mark and make sure that you can see: Any grab bars or handrails. First and last steps. Where the edge of each step is. Use tools that help you move around (mobility aids) if they are needed. These include: Canes. Walkers. Scooters. Crutches. Turn on the lights when you go into a dark area. Replace any light bulbs as soon as they burn out. Set up your furniture so you have a clear path. Avoid moving your furniture around. If any of your floors are uneven, fix them. If there are any pets around you, be aware of where they are. Review your medicines with your doctor. Some medicines can make you feel dizzy. This can increase your chance of falling. Ask your doctor what other things that you can do to help prevent falls. This information is not intended to replace advice given to you by your health care provider. Make sure you discuss any questions you have with your health care provider. Document Released: 05/27/2009 Document Revised: 01/06/2016 Document Reviewed: 09/04/2014 Elsevier Interactive Patient Education  2017 [employer].

## 2021-12-29 NOTE — Progress Notes (Addendum)
Virtual Visit via Telephone Note  I connected with  Keith Curtis on 12/29/21 at  1:45 PM EDT by telephone and verified that I am speaking with the correct person using two identifiers.  Medicare Annual Wellness visit completed telephonically due to Covid-19 pandemic.   Persons participating in this call: This Health Coach and this patient.   Location: Patient: Home  Provider: Office    I discussed the limitations, risks, security and privacy concerns of performing an evaluation and management service by telephone and the availability of in person appointments. The patient expressed understanding and agreed to proceed.  Unable to perform video visit due to video visit attempted and failed and/or patient does not have video capability.   Some vital signs may be absent or patient reported.   Willette Brace, LPN   Subjective:   Keith Curtis is a 71 y.o. male who presents for Medicare Annual/Subsequent preventive examination.  Review of Systems     Cardiac Risk Factors include: advanced age (>65mn, >>86women);smoking/ tobacco exposure;male gender     Objective:    There were no vitals filed for this visit. There is no height or weight on file to calculate BMI.     12/29/2021    1:49 PM 10/29/2020    1:49 PM 04/01/2020    1:52 PM 03/26/2019    3:16 PM 11/27/2017    2:26 PM  Advanced Directives  Does Patient Have a Medical Advance Directive? Yes No No No No  Type of [place]      Copy of [place] Chart? No - copy requested      Would patient like information on creating a medical advance directive?   Yes (MAU/Ambulatory/Procedural Areas - Information given) No - Patient declined Yes (MAU/Ambulatory/Procedural Areas - Information given)    Current Medications (verified) Outpatient Encounter Medications as of 12/29/2021  Medication Sig   albuterol (VENTOLIN HFA) 108 (90 Base) MCG/ACT inhaler Inhale 2 puffs  into the lungs every 4 (four) hours as needed for wheezing or shortness of breath.   clobetasol cream (TEMOVATE) 0.05 % Apply topically. (Patient not taking: Reported on 12/08/2021)   rosuvastatin (CRESTOR) 10 MG tablet Take 1 tablet (10 mg total) by mouth once a week. (Patient not taking: Reported on 12/08/2021)   sildenafil (VIAGRA) 50 MG tablet Take 50 mg by mouth daily as needed for erectile dysfunction.   triamcinolone cream (KENALOG) 0.1 % Apply 1 application topically 2 (two) times daily. For 7-10 days maximum (Patient not taking: Reported on 12/08/2021)   No facility-administered encounter medications on file as of 12/29/2021.    Allergies (verified) Patient has no known allergies.   History: Past Medical History:  Diagnosis Date   Adenomatous polyps    [place]   Melanoma-removed   COPD (chronic obstructive pulmonary disease) ([place]    Erectile dysfunction    [place] [facility] [place] 40 or '60mg'$    Genital herpes    last outbreak 2012 or before   Hyperlipidemia    ON ROSUVASTATIN   Marijuana use    alomst on daily basis   Tobacco abuse    1.5 PPD. uses as a crutch. not ready to quit   Past Surgical History:  Procedure Laterality Date   APPENDECTOMY  1975   COLONOSCOPY  09/02/2021   CYST EXCISION     left shoulder blade   hernia bilateral     inguinal- both sides-  3 surgeries total. around 2009 last surgery.    POLYPECTOMY     TA + 09-02-2021   Family History  Problem Relation Age of Onset   Other Mother        died in 109s- unknown cause   Heart attack Father        died at age 44   Stroke Father        25   Colon polyps Maternal Uncle    Colon cancer Neg Hx    Esophageal cancer Neg Hx    Stomach cancer Neg Hx    Rectal cancer Neg Hx    Social History   Socioeconomic History   Marital status: Divorced    Spouse name: Not on file   Number of children: Not on file   Years of education: Not on file   Highest education level: Not on file   Occupational History   Occupation: Archivist  Tobacco Use   Smoking status: Every Day    Packs/day: 1.00    Years: 30.00    Pack years: 30.00    Types: Cigarettes   Smokeless tobacco: Never   Tobacco comments:    Had a cigarette at 1130 am today 09/02/21  Vaping Use   Vaping Use: Never used  Substance and Sexual Activity   Alcohol use: Yes    Alcohol/week: 4.0 standard drinks    Types: 4 Standard drinks or equivalent per week    Comment: occassional   Drug use: Not Currently    Frequency: 7.0 times per week    Types: Marijuana    Comment: marijuana use daily   Sexual activity: Yes    Comment: GF already has genital herpes  Other Topics Concern   Not on file  Social History Narrative   Divorced. GF 10 years in 2018. Son from prior marriage. No grandkids.       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       HObbies: time with gf, playing guitar, time in [place]- [place] house [place] with GF. Enjoys hiking.    Social Determinants of Health   Financial Resource Strain: Low Risk    Difficulty of Paying Living Expenses: Not hard at all  Food Insecurity: No Food Insecurity   Worried About Charity fundraiser in the Last Year: Never true   [place] in the Last Year: Never true  Transportation Needs: No Transportation Needs   Lack of Transportation (Medical): No   Lack of Transportation (Non-Medical): No  Physical Activity: Inactive   Days of Exercise per Week: 0 days   Minutes of Exercise per Session: 0 min  Stress: No Stress Concern Present   Feeling of Stress : Only a little  Social Connections: Socially Isolated   Frequency of Communication with Friends and Family: More than three times a week   Frequency of Social Gatherings with Friends and Family: More than three times a week   Attends Religious Services: Never   Marine scientist or Organizations: No   Attends Music therapist: Never    Marital Status: Divorced    Tobacco Counseling Ready to quit: Not Answered Counseling given: Not Answered Tobacco comments: Had a cigarette at 1130 am today 09/02/21   Clinical Intake:  Pre-visit preparation completed: Yes  Pain : No/denies pain     BMI - recorded: 25.68 Nutritional Status: BMI 25 -29 Overweight Nutritional Risks: None  Diabetes: No  How often do you need to have someone help you when you read instructions, pamphlets, or other written materials from your doctor or pharmacy?: 1 - Never  Diabetic?no  Interpreter Needed?: No  Information entered by :: Charlott Rakes, LPN   Activities of Daily Living    12/29/2021    1:52 PM 04/25/2021    2:23 PM  In your present state of health, do you have any difficulty performing the following activities:  Hearing? 1 0  Vision? 0 0  Difficulty concentrating or making decisions? 0 0  Walking or climbing stairs? 0 0  Dressing or bathing? 0 0  Doing errands, shopping? 0 0  Preparing Food and eating ? N   Using the Toilet? N   In the past six months, have you accidently leaked urine? Y   Comment at times   Do you have problems with loss of bowel control? N   Managing your Medications? N   Managing your Finances? N   Housekeeping or managing your Housekeeping? N     Patient Care Team: Marin Olp, MD as PCP - General (Family Medicine)  Indicate any recent Medical Services you may have received from other than [facility] providers in the past year (date may be approximate).     Assessment:   This is a routine wellness examination for [place].  Hearing/Vision screen Hearing Screening - Comments:: Pt  stated slight  loss Vision Screening - Comments:: Encouraged to follow up    Dietary issues and exercise activities discussed: Current Exercise Habits: The patient does not participate in regular exercise at present   Goals Addressed             This Visit's Progress    Patient Stated       None at this  time        Depression Screen    12/29/2021    1:47 PM 04/25/2021    2:21 PM 04/01/2020    1:49 PM 03/26/2019    3:16 PM 11/27/2017    2:31 PM 09/25/2017    2:31 PM 09/01/2016    3:42 PM  PHQ 2/9 Scores  PHQ - 2 Score 1 1 0 0 0 0 0  PHQ- 9 Score 1 2   0      Fall Risk    12/29/2021    1:51 PM 04/25/2021    2:20 PM 04/01/2020    1:54 PM 03/26/2019    3:16 PM 11/27/2017    2:31 PM  [place] in the past year? 0 0 0 0 No  Number falls in past yr: 0 0 0 0   Injury with Fall? 0 0 0 0   Risk for fall due to : Impaired vision No Fall Risks Impaired vision    Follow up Falls prevention discussed Falls evaluation completed Falls prevention discussed Education provided     [place]:  Any stairs in or around the home? Yes  If so, are there any without handrails? No  Home free of loose throw rugs in walkways, pet beds, electrical cords, etc? Yes  Adequate lighting in your home to reduce risk of falls? Yes   ASSISTIVE DEVICES UTILIZED TO PREVENT FALLS:  Life alert? No  Use of a cane, walker or w/c? No  Grab bars in the bathroom? Yes  Shower chair or bench in shower? No  Elevated toilet seat or a handicapped toilet? No   TIMED  UP AND GO:  Was the test performed? No .  Cognitive Function:        12/29/2021    1:49 PM 04/01/2020    1:57 PM  6CIT Screen  What Year? 0 points 0 points  What month? 0 points 0 points  What time? 0 points   Count back from 20 0 points 0 points  Months in reverse 0 points 0 points  Repeat phrase 0 points 0 points  Total Score 0 points     Immunizations Immunization History  Administered Date(s) Administered   Moderna Sars-Covid-2 Vaccination 06/15/2020, 07/13/2020      Flu Vaccine status: Due, Education has been provided regarding the importance of this vaccine. Advised may receive this vaccine at local pharmacy or Health Dept. Aware to provide a copy of the vaccination record if obtained from local  pharmacy or Health Dept. Verbalized acceptance and understanding.  Pneumococcal vaccine status: Due, Education has been provided regarding the importance of this vaccine. Advised may receive this vaccine at local pharmacy or Health Dept. Aware to provide a copy of the vaccination record if obtained from local pharmacy or Health Dept. Verbalized acceptance and understanding.  Covid-19 vaccine status: Completed vaccines  Qualifies for Shingles Vaccine? No   Zostavax completed No     Screening Tests Health Maintenance  Topic Date Due   COVID-19 Vaccine (3 - Booster for Moderna series) 05/11/2022 (Originally 09/07/2020)   Pneumonia Vaccine 78+ Years old (1 - PCV) 10/26/2022 (Originally 07/01/1957)   INFLUENZA VACCINE  03/14/2022   COLONOSCOPY (Pts 45-57yr Insurance coverage will need to be confirmed)  12/08/2024   Hepatitis C Screening  Completed   HPV VACCINES  Aged Out   TETANUS/TDAP  Discontinued   Zoster Vaccines- Shingrix  Discontinued    Health Maintenance  There are no preventive care reminders to display for this patient.  Colorectal cancer screening: Type of screening: Colonoscopy. Completed 12/08/21. Repeat every 3 years  Additional Screening:  Hepatitis C Screening:  Completed 09/25/17  Vision Screening: Recommended annual ophthalmology exams for early detection of glaucoma and other disorders of the eye. Is the patient up to date with their annual eye exam?  No  Who is the provider or what is the name of the office in which the patient attends annual eye exams? Encouraged to follow up  If pt is not established with a provider, would they like to be referred to a provider to establish care? No .   Dental Screening: Recommended annual dental exams for proper oral hygiene  Community Resource Referral / Chronic Care Management: CRR required this visit?  No   CCM required this visit?  No      Plan:     I have personally reviewed and noted the following in the  patient's chart:   Medical and social history Use of alcohol, tobacco or illicit drugs  Current medications and supplements including opioid prescriptions. Patient is not currently taking opioid prescriptions. Functional ability and status Nutritional status Physical activity Advanced directives List of other physicians Hospitalizations, surgeries, and ER visits in previous 12 months Vitals Screenings to include cognitive, depression, and falls Referrals and appointments  In addition, I have reviewed and discussed with patient certain preventive protocols, quality metrics, and best practice recommendations. A written personalized care plan for preventive services as well as general preventive health recommendations were provided to patient.     TWillette Brace LPN   [id number]  Nurse Notes: None

## 2022-02-07 ENCOUNTER — Ambulatory Visit (INDEPENDENT_AMBULATORY_CARE_PROVIDER_SITE_OTHER): Payer: PPO | Admitting: Family Medicine

## 2022-02-07 ENCOUNTER — Encounter: Payer: Self-pay | Admitting: Family Medicine

## 2022-02-07 VITALS — BP 104/60 | HR 56 | Temp 98.3°F | Ht 74.0 in | Wt 197.0 lb

## 2022-02-07 DIAGNOSIS — R972 Elevated prostate specific antigen [PSA]: Secondary | ICD-10-CM | POA: Diagnosis not present

## 2022-02-07 DIAGNOSIS — I700 Atherosclerosis of aorta: Secondary | ICD-10-CM

## 2022-02-07 DIAGNOSIS — E785 Hyperlipidemia, unspecified: Secondary | ICD-10-CM

## 2022-02-07 DIAGNOSIS — R7989 Other specified abnormal findings of blood chemistry: Secondary | ICD-10-CM

## 2022-02-07 DIAGNOSIS — J449 Chronic obstructive pulmonary disease, unspecified: Secondary | ICD-10-CM

## 2022-02-07 DIAGNOSIS — F17200 Nicotine dependence, unspecified, uncomplicated: Secondary | ICD-10-CM

## 2022-02-07 MED ORDER — ALBUTEROL SULFATE HFA 108 (90 BASE) MCG/ACT IN AERS
2.0000 | INHALATION_SPRAY | RESPIRATORY_TRACT | 5 refills | Status: DC | PRN
Start: 2022-02-07 — End: 2022-10-27

## 2022-02-09 DIAGNOSIS — R3915 Urgency of urination: Secondary | ICD-10-CM | POA: Diagnosis not present

## 2022-02-09 DIAGNOSIS — N401 Enlarged prostate with lower urinary tract symptoms: Secondary | ICD-10-CM | POA: Diagnosis not present

## 2022-02-09 DIAGNOSIS — R972 Elevated prostate specific antigen [PSA]: Secondary | ICD-10-CM | POA: Diagnosis not present

## 2022-02-10 ENCOUNTER — Telehealth: Payer: Self-pay | Admitting: Family Medicine

## 2022-02-10 DIAGNOSIS — J449 Chronic obstructive pulmonary disease, unspecified: Secondary | ICD-10-CM

## 2022-02-10 DIAGNOSIS — R0602 Shortness of breath: Secondary | ICD-10-CM

## 2022-02-10 MED ORDER — PREDNISONE 50 MG PO TABS: 50.0000 mg | ORAL_TABLET | Freq: Every day | ORAL | 0 refills | Status: DC

## 2022-02-10 NOTE — Telephone Encounter (Signed)
Pt states he needs to speak with Dr. Yong Channel directly and would like a call back.  Pt states he is "having a recurrence of a breathing issue". Pt declined to share more information because Dr. Yong Channel knows all about what is going on.  FO Rep could hear heavy/labored breathing and the patient needed to pause mid sentence to take a breath.   Pt consented to speak to the PCP triage nurse but insisted that Dr. Yong Channel should be calling him.   Referred to PCP Triage Nurse, spoke with patient coordinator, [place].

## 2022-02-10 NOTE — Telephone Encounter (Signed)
"  Go to ED Now"  Patient Name: Keith Curtis Gender: Male DOB: Nov 24, 1950 Age: 71 Y 73 M 12 D Return Phone Number: [phone] (Primary), [phone] (Secondary) Address: City/ State/ Zip: [place]  [address] Client [place] [facility] [place] Client Site [place] [facility] [place] Day Provider Garret Reddish- MD Contact Type Call Who Is Calling Patient / Member / Family / Caregiver Call Type Triage / Clinical Relationship To Patient Self Return Phone Number [phone] (Primary) Chief Complaint BREATHING - shortness of breath or sounds breathless Reason for Call Symptomatic / Request for [place] states he is experiencing shortness of breath. Translation No Nurse Assessment Nurse: Thad Ranger, RN, Denise Date/Time (Eastern Time): 02/10/2022 8:47:53 AM Confirm and document reason for call. If symptomatic, describe symptoms. ---Caller states he is experiencing shortness of breath. No other s/s. Does the patient have any new or worsening symptoms? ---Yes Will a triage be completed? ---Yes Related visit to physician within the last 2 weeks? ---No Does the PT have any chronic conditions? (i.e. diabetes, asthma, this includes High risk factors for pregnancy, etc.) ---No Is this a behavioral health or substance abuse call? ---No Guidelines Guideline Title Affirmed Question Affirmed Notes Nurse Date/Time (Eastern Time) Breathing Difficulty [1] MODERATE difficulty breathing (e.g., speaks in phrases, SOB even at rest, pulse 100-120) AND [2] NEW-onset or WORSE than normal Carmon, RN, Langley Gauss 02/10/2022 8:49:35 AM   Disp. Time Eilene Ghazi Time) Disposition Final User 02/10/2022 8:46:02 AM Send to Urgent Queue [place] 02/10/2022 8:53:55 AM Go to ED Now Yes Thad Ranger, RN, Langley Gauss Final Disposition 02/10/2022 8:53:55 AM Go to ED Now Yes Carmon, RN, Yevette Edwards Disagree/Comply Disagree Caller  Understands Yes PreDisposition Call Doctor Care Advice Given Per Guideline GO TO ED NOW: NOTE TO TRIAGER - DRIVING: * Another adult should drive. BRING MEDICINES: * Bring a list of your current medicines when you go to the Emergency Department (ER). CARE ADVICE given per Breathing Difficulty (Adult) guideline. Referrals GO TO FACILITY REFUSED

## 2022-02-10 NOTE — Telephone Encounter (Signed)
Pt cold transferred back to [facility] front office line, not the nurse line.  Pt states the nurse instructed him to go to the emergency room.  Pt states he refuses to do this and wants to speak with PCP.   FO Rep explained that the PCP is with patients and will be unavailable for a while, therefore no further steps can be taken until he is consulted.  Pt wanted confirmation that Dr. Yong Channel would be calling him, and FO Rep could not provide that.  Pt states this situation is "fine" because this "is not an emergency situation".   Awaiting follow up PCP triage nurse notes.

## 2022-02-10 NOTE — Telephone Encounter (Signed)
LVM that with severity of symptoms reported would recommend ER or urgent care trip. No DPR on file but felt safest to leave message given urgency of message  If he declines- I sent in some prednisone for him and LVM for him to take this if he refuses to go to urgent care or ED and still should go if not improving. Taking at night may keep him up but dont want symptoms to worsen- can take 48 hours to really kick in.  - can schedule follow up for Monday if he did not get evaluated in ER/ED  Side note- would ask that I be alerted directly by someone if ER is refused for other patients (since I do not always get to see messages suring day) to see about possible work in- we had a 4 pm visit available at time message was sent to me  Albuterol was already sent in on June 27th so he can pick this up

## 2022-02-10 NOTE — Telephone Encounter (Signed)
FYI, see phone note prior to triage note.

## 2022-02-10 NOTE — Telephone Encounter (Signed)
Please see message. °

## 2022-02-10 NOTE — Telephone Encounter (Signed)
Patient called hoping to speak with Dr. Yong Channel in terms of his symptoms that he was instructed to go to the ED for. States he wanted to know if he could be on the same regimen of amoxicillin and albuterol that he was put on last year when he experienced the same thing. I stressed with patient that while I can send a note, if symptoms worsen he will need to go to the ED. Please advise if pt can be seen in office on Monday or if regimen can be prescribed.

## 2022-02-13 ENCOUNTER — Telehealth: Payer: Self-pay | Admitting: Family Medicine

## 2022-02-13 NOTE — Telephone Encounter (Signed)
Patient called and stated that although he received Dr. Ansel Bong VM on 06/30, he does not understand why he wasn't able to talk with him directly in real time. States he tried to call back immediately but was meant with no answer. He believes this to be a trick of some sort to not talk to patients. At this time he is managing well but still would like to talk with Dr. Yong Channel because he is not sure where to go from here.

## 2022-02-13 NOTE — Telephone Encounter (Signed)
I tried to schedule a VV with pt during our earlier conversation but he refused. He wanted a phone call from [place]. I tried to explain to patient that this would be the best option to talk with Dr. Yong Channel one on one but he did not want to hear this.

## 2022-02-13 NOTE — Telephone Encounter (Signed)
FYI

## 2022-02-13 NOTE — Telephone Encounter (Signed)
If pt still would like to speak with Dr. Yong Channel please schedule OV or virtual for pt to speak with Dr. Yong Channel.

## 2022-02-13 NOTE — Telephone Encounter (Signed)
The issue he is having he was not having at the time of our visit- I'm more than happy to chat with him for a virtual visit or even a phone visit but he has to consent to a visit for me to be able to call him  Hasna or jeanette can you please explain this to him- we are spending a lot of time on messages. I left him a voicemail last week and he called the office back but it was after hours- this goes to general office line and not my phone- can you please explain that to him? Its not a trick  Team sending to you- to reach out to jeanette or hasna if they dont see this soon

## 2022-02-21 ENCOUNTER — Telehealth: Payer: Self-pay | Admitting: Family Medicine

## 2022-02-21 MED ORDER — BUDESONIDE-FORMOTEROL FUMARATE 160-4.5 MCG/ACT IN AERO
2.0000 | INHALATION_SPRAY | Freq: Two times a day (BID) | RESPIRATORY_TRACT | 12 refills | Status: DC
Start: 2022-02-21 — End: 2022-10-27

## 2022-02-21 MED ORDER — PREDNISONE 50 MG PO TABS: 50.0000 mg | ORAL_TABLET | Freq: Every day | ORAL | 0 refills | Status: DC

## 2022-02-21 NOTE — Telephone Encounter (Addendum)
Error

## 2022-02-21 NOTE — Telephone Encounter (Signed)
Appointment cancelled

## 2022-02-21 NOTE — Telephone Encounter (Signed)
See below

## 2022-02-21 NOTE — Telephone Encounter (Addendum)
Pt states: -he quit smoking and finished his prednisone. -difficulty breathing has come back  -PCP triage nurse recommended to go to ED -doesn't want to go to the ED because of possible wait times and previous experience with feeling his problems were making people angry,. -frustrated that he cannot see PCP immediately or talk with PCP. -didn't want to call office because Triage Nurse would tell him to go to ED. -reluctant to use inhaler     After speaking with patient, pt is planning to seek treatment at one of the [facility] in [place] or [facility]. Addresses given.   Awaiting follow up PCP triage notes

## 2022-02-21 NOTE — Telephone Encounter (Addendum)
Team- cancel appointment for tomorrow  Friday before last prescribed prednisone- he felt better within about 12 hours. Has been doing well even on saturday- rode 5 miles on his bike and felt well on the bike - starting back last night after predinsone out of system for several days noted worsening shortness of breath again. He is also wheezing some. Coughing more but not much more than usual. Not purulent. Albuterol helps him breath better- using it every 1-2 hours.   No chest pain  Has not tested for covid- will test before getting x-ray at [place] go ahead and start prednisone  Also start [place] with referral to pulmonology now  He has completely quit smoking since around that time.   If worsens- advised return to ER

## 2022-02-21 NOTE — Telephone Encounter (Signed)
Patient Name: Keith Curtis Gender: Male DOB: [birth year] Age: 71 Y 63 M 23 D Return Phone Number: [phone] (Primary), [phone] (Secondary) Address: City/ State/ Zip: [place]  [address] Client [place] [facility] [place] Client Site [place] [facility] [place] Day Provider Garret Reddish- MD Contact Type Call Who Is Calling Patient / Member / Family / Caregiver Call Type Triage / Clinical Relationship To Patient Self Return Phone Number [phone] (Primary) Chief Complaint BREATHING - shortness of breath or sounds breathless Reason for Call Symptomatic / Request for Health Information Initial Comment Caller states shortness of breath,mild headache, was provided medication(inhaler), unsure what to do. Translation No Nurse Assessment Nurse: Nicki Reaper, RN, Malachy Mood Date/Time (Eastern Time): 02/21/2022 8:50:03 AM Confirm and document reason for call. If symptomatic, describe symptoms. ---Caller states he has SOB that started last night around midnight,used Albuterol inhaler last night and it helped, last dose of Albuterol inhaler was 15 minutes ago, is not having SOB, last night kept waking up with SOB, denies cough, CP, fever and other symptoms Does the patient have any new or worsening symptoms? ---Yes Will a triage be completed? ---Yes Related visit to physician within the last 2 weeks? ---No Does the PT have any chronic conditions? (i.e. diabetes, asthma, this includes High risk factors for pregnancy, etc.) ---Yes List chronic conditions. ---COPD Is this a behavioral health or substance abuse call? ---No Guidelines Guideline Title Affirmed Question Affirmed Notes Nurse Date/Time (Eastern Time) Asthma Attack Quick-relief asthma medicine (e.g., albuterol Nyra Capes, levalbuterol by inhaler or nebulizer) Nicki Reaper, RN, Malachy Mood 02/21/2022 8:54:29 AM  Guidelines Guideline Title Affirmed Question Affirmed Notes Nurse Date/Time  (Eastern Time) is needed more frequently than every 4 hours to keep you comfortable Disp. Time Eilene Ghazi Time) Disposition Final User 02/21/2022 8:47:33 AM Send to Urgent [place], [place] 02/21/2022 9:00:57 AM Go to ED Now (or PCP triage) Yes Nicki Reaper, RN, Malachy Mood Final Disposition 02/21/2022 9:00:57 AM Go to ED Now (or PCP triage) Yes Nicki Reaper, RN, Erskine Speed Disagree/Comply Disagree Caller Understands Yes PreDisposition Call Doctor Care Advice Given Per Guideline GO TO ED NOW (OR PCP TRIAGE): * IF NO PCP (PRIMARY CARE PROVIDER) SECOND-LEVEL TRIAGE: You need to be seen within the next hour. Go to the [place] [facility] at _____________ [place] as soon as you can. ASTHMA ATTACK - TREATMENT - QUICK-RELIEF MEDICINE: * If you have not used your inhaler or nebulizer in the PAST 20 MINUTES, take 2 to 4 puffs of your quick-relief ALBUTEROL (SALBUTAMOL) INHALER right now. BRING MEDICINES: * Please bring a list of your current medicines when you go to see the doctor. * It is also a good idea to bring the pill bottles too. This will help the doctor to make certain you are taking the right medicines and the right dose. Comments User: Burna Sis, RN Date/Time Eilene Ghazi Time): 02/21/2022 9:03:42 AM Caller is upset with the triage process, refused ER, instructed it is my recommendation and if he does not want to go to the ER that is his choice, instructed he can call his PCP and let them know he spoke with triage nurse and is refusing ER he stated the office sent him to me, cold transferred to office Referrals [place] REFUSED

## 2022-02-21 NOTE — Addendum Note (Signed)
Addended by: Marin Olp on: 02/21/2022 05:54 PM   Modules accepted: Orders

## 2022-02-21 NOTE — Telephone Encounter (Addendum)
Pt declined ED and is open to seeing any provider at [facility].   Pt is using inhaler.  Patient requests prednisone because it worked so quickly for last occurrence of shortness of breath.   Patient is scheduled with Dr. Jonni Sanger for 07/12  Pt understands: -FO Rep cannot guarantee Dr. Yong Channel will be able to call him tonight. -FO Rep cannot guarantee Dr. Yong Channel will prescribe refill of steroid if pt will see another provider.    Routing to Dr. Yong Channel and Dr. Tamela Oddi team

## 2022-02-21 NOTE — Telephone Encounter (Signed)
Patient called stating that although he had been feeling better since his last triage call on 06/30, he began experiencing shortness of breath once more. States that he believed the prednisone helped as well as his recent tobacco cessation. He went on to express upset about how he felt Dr. Yong Channel was not as involved in his care despite him previously declining an OV with Dr. Yong Channel last week. I was able to schedule patient with Dr. Yong Channel on 03/03/22 at 10:20am, but sent him to triage for further evaluation.

## 2022-02-22 ENCOUNTER — Ambulatory Visit (INDEPENDENT_AMBULATORY_CARE_PROVIDER_SITE_OTHER)
Admission: RE | Admit: 2022-02-22 | Discharge: 2022-02-22 | Disposition: A | Discharge status: Home / Self Care | Payer: PPO | Source: Ambulatory Visit | Attending: Family Medicine | Admitting: Family Medicine

## 2022-02-22 ENCOUNTER — Ambulatory Visit: Payer: PPO | Admitting: Family Medicine

## 2022-02-22 DIAGNOSIS — J441 Chronic obstructive pulmonary disease with (acute) exacerbation: Secondary | ICD-10-CM | POA: Diagnosis not present

## 2022-02-22 DIAGNOSIS — R0602 Shortness of breath: Secondary | ICD-10-CM | POA: Diagnosis not present

## 2022-02-22 NOTE — Telephone Encounter (Signed)
Spoke with pt to let him know to keep his appt on the 21st '@10'$ :20 and he agreed.

## 2022-02-22 NOTE — Telephone Encounter (Signed)
I think keeping appointment on 21st is reasonable to see how he is doing

## 2022-02-22 NOTE — Telephone Encounter (Signed)
Do you want him to keep the 07/21 appointment at a f/u?

## 2022-02-22 NOTE — Telephone Encounter (Signed)
Please advise.  Should [place] staff reschedule/cancel 03/03/22 appointment with PCP

## 2022-03-02 DIAGNOSIS — Z08 Encounter for follow-up examination after completed treatment for malignant neoplasm: Secondary | ICD-10-CM | POA: Diagnosis not present

## 2022-03-02 DIAGNOSIS — Z1283 Encounter for screening for malignant neoplasm of skin: Secondary | ICD-10-CM | POA: Diagnosis not present

## 2022-03-02 DIAGNOSIS — L821 Other seborrheic keratosis: Secondary | ICD-10-CM | POA: Diagnosis not present

## 2022-03-02 DIAGNOSIS — Z86006 Personal history of melanoma in-situ: Secondary | ICD-10-CM | POA: Diagnosis not present

## 2022-03-03 ENCOUNTER — Encounter: Payer: Self-pay | Admitting: *Deleted

## 2022-03-03 ENCOUNTER — Ambulatory Visit (INDEPENDENT_AMBULATORY_CARE_PROVIDER_SITE_OTHER): Payer: PPO | Admitting: Family Medicine

## 2022-03-03 ENCOUNTER — Encounter: Payer: Self-pay | Admitting: Family Medicine

## 2022-03-03 VITALS — BP 110/64 | HR 77 | Temp 97.8°F | Resp 17 | Ht 74.0 in | Wt 206.0 lb

## 2022-03-03 DIAGNOSIS — E785 Hyperlipidemia, unspecified: Secondary | ICD-10-CM

## 2022-03-03 DIAGNOSIS — J449 Chronic obstructive pulmonary disease, unspecified: Secondary | ICD-10-CM | POA: Diagnosis not present

## 2022-03-03 DIAGNOSIS — E781 Pure hyperglyceridemia: Secondary | ICD-10-CM | POA: Diagnosis not present

## 2022-03-03 LAB — COMPREHENSIVE METABOLIC PANEL
ALT: 20 U/L (ref 0–53)
AST: 19 U/L (ref 0–37)
Albumin: 3.8 g/dL (ref 3.5–5.2)
Alkaline Phosphatase: 66 U/L (ref 39–117)
BUN: 18 mg/dL (ref 6–23)
CO2: 32 mEq/L (ref 19–32)
Calcium: 8.7 mg/dL (ref 8.4–10.5)
Chloride: 100 mEq/L (ref 96–112)
Creatinine, Ser: 0.96 mg/dL (ref 0.40–1.50)
GFR: 79.43 mL/min (ref >60.00)
Glucose, Bld: 94 mg/dL (ref 70–99)
Potassium: 4.5 mEq/L (ref 3.5–5.1)
Sodium: 138 mEq/L (ref 135–145)
Total Bilirubin: 0.4 mg/dL (ref 0.2–1.2)
Total Protein: 6.2 g/dL (ref 6.0–8.3)

## 2022-03-03 LAB — LDL CHOLESTEROL, DIRECT: Direct LDL: 66 mg/dL

## 2022-03-03 NOTE — Patient Instructions (Addendum)
I am sorry pulmonology has not had a chance to call you yet- we can expedite this by having you call Address: [address], [place], [place] [address] Hours:  Phone: [phone]  Please stop by lab before you go If you have mychart- we will send your results within 3 business days of [place] receiving them.  If you do not have mychart- we will call you about results within 5 business days of [place] receiving them.  *please also note that you will see labs on mychart as soon as they post. I will later go in and write notes on them- will say "notes from Dr. Yong Channel"   Recommended follow up: Return for next already scheduled visit or sooner if needed.

## 2022-03-03 NOTE — Progress Notes (Signed)
Phone [phone] In person visit   Subjective:   Keith Curtis is a 71 y.o. year old very pleasant male patient who presents for/with See problem oriented charting Chief Complaint  Patient presents with   Referral    Pulmonology     Past Medical History-  Patient Active Problem List   Diagnosis Date Noted   COPD (chronic obstructive pulmonary disease) ([place]) 04/25/2021    Priority: High   Tobacco dependence     Priority: High   History of melanoma 10/25/2021    Priority: Medium    Aortic atherosclerosis ([place]) 04/30/2021    Priority: Medium    Hypertriglyceridemia 03/25/2018    Priority: Medium    Lipoma of left shoulder 09/01/2016    Priority: Medium    Marijuana use     Priority: Medium    Knee osteoarthritis 09/01/2016    Priority: Low   Genital herpes     Priority: Low   Erectile dysfunction     Priority: Low   Varicose veins of both lower extremities 10/15/2018    Medications- reviewed and updated Current Outpatient Medications  Medication Sig Dispense Refill   albuterol (VENTOLIN HFA) 108 (90 Base) MCG/ACT inhaler Inhale 2 puffs into the lungs every 4 (four) hours as needed for wheezing or shortness of breath. 1 each 5   budesonide-formoterol (SYMBICORT) 160-4.5 MCG/ACT inhaler Inhale 2 puffs into the lungs in the morning and at bedtime. 1 each 12   clobetasol cream (TEMOVATE) 0.05 % Apply topically.     rosuvastatin (CRESTOR) 10 MG tablet Take 1 tablet (10 mg total) by mouth once a week. 13 tablet 3   sildenafil (VIAGRA) 50 MG tablet Take 50 mg by mouth daily as needed for erectile dysfunction.     triamcinolone cream (KENALOG) 0.1 % Apply 1 application topically 2 (two) times daily. For 7-10 days maximum 30 g 0   No current facility-administered medications for this visit.     Objective:  BP 110/64   Pulse 77   Temp 97.8 F (36.6 C) (Temporal)   Resp 17   Ht '6\' 2"'$  (1.88 m)   Wt 206 lb (93.4 kg)   SpO2 91%   BMI 26.45 kg/m  Gen: NAD, resting  comfortably CV: RRR no murmurs rubs or gallops Lungs: CTAB no crackles, wheeze, rhonchi Ext: Trace bilateral edema Skin: warm, dry     Assessment and Plan   # COPD  S: Patient reports no shortness of breath. Occasional cough but overall much improved. Weight up about 9 lbs in a month- feels able to eat more easily- encouraged weight stabilization. Feels mentally and emotionally better.  - cigarette free and marijuana free for 3 weeks. Had been down to 1/2 PPD but was highly motivated by recent illness Maintenance medications: symbicort 2 puffs twice daily starting late June after COPD flare- finished prednisone on July 26th- felt much better within 24 hours.  - no leg swelling or calf pain  Patient has had to use albuterol rarely- once in last few weeks - no oxygen at home  A/P: COPD much improved after recent exacerbation.  Initially treated with prednisone alone and had recurrence within several days-with second exacerbation started Symbicort and has not had recurrence.  Recommended continuing this for now.  Not needing albuterol almost at all since starting this - Oxygen level running in the low 90s typically-with no current shortness of breath doubt something like pulmonary embolism - Had referred to pulmonology for follow-up but he has not  received a call yet-I gave him the information to call them directly -3 weeks cigarette and marijuana free-congratulated him on progress!  He was heavily motivated by recent illness  #hyperlipidemia/hypertriglyceridemia-improved recently S: Medication: rosuvastatin '10mg'$  once a week Lab Results  Component Value Date   CHOL 161 04/28/2021   HDL 61.70 04/28/2021   LDLCALC 73 04/28/2021   TRIG 134.0 04/28/2021   CHOLHDL 3 04/28/2021   A/P: Hopefully improved-update LDL with labs today.  Prefer LDL under 70 given smoking history though thrilled he has quit  #elevated PSA-he saw urology in June and 3 month follow up. Holding off on  biopsy  Recommended follow up: Return for next already scheduled visit or sooner if needed. Future Appointments  Date Time Provider [place]  11/09/2022  1:00 PM Marin Olp, MD [facility]  01/04/2023  3:00 PM [facility] HE[facility] COACH [facility]   Lab/Order associations:   ICD-10-CM   1. Chronic obstructive pulmonary disease, unspecified COPD type ([place])  J44.9     2. Hyperlipidemia, unspecified hyperlipidemia type  E78.5 LDL cholesterol, direct    3. Hypertriglyceridemia  E78.1 Comprehensive metabolic panel      Return precautions advised.  Garret Reddish, MD

## 2022-03-16 ENCOUNTER — Encounter: Payer: Self-pay | Admitting: Pulmonary Disease

## 2022-03-16 ENCOUNTER — Ambulatory Visit: Payer: PPO | Admitting: Pulmonary Disease

## 2022-03-16 VITALS — BP 124/68 | HR 64 | Temp 98.6°F | Ht 74.0 in

## 2022-03-16 DIAGNOSIS — R0609 Other forms of dyspnea: Secondary | ICD-10-CM

## 2022-03-16 NOTE — Patient Instructions (Signed)
Nice to meet you  No change in the medications, continue Symbicort 2 puffs twice a day and albuterol as needed.  I ordered pulmonary function test today to better define your lung function and to make sure we are treating the correct things and to make sure not missing anything else that could be contributing to some of your symptoms.  Return to clinic in 3 months or sooner as needed, pulmonary function test scheduled same day as follow-up visit with Jenniferlynn Saad so we can go over results after test

## 2022-03-18 NOTE — Progress Notes (Signed)
'@Patient'  ID: Keith Curtis, male    DOB: March 04, 1951, 71 y.o.   MRN: [id number]  Chief Complaint  Patient presents with   Consult    Pt is a consult for eval for copd. Pt states he has been dx with copd. Pt states he has SOB, wakes up at night and cant cath his breath. First time it occurred was March 2022. 5-6 weeks ago pt states he had a flare up. Pt takes Albuterol as needed and Symbicort.     Referring provider: Marin Olp, MD  HPI:   71 y.o. man whom are seen in consultation evaluation of possible COPD, bronchitis flareups.  Note from referring provider reviewed.  In general, has no issues with breathing.  Feels fine.  Deny significant dyspnea or shortness of breath.  Occasionally gets short of breath with strenuous activity but feels like this is normal.  Day-to-day no issues.  However, he developed prolonged symptoms of cough chest tightness shortness of breath.  12 October 2020.  Unclear inciting event.  Some chest tightness associate with this.  This gradually improved over time.  Markedly for prednisone therapy.  Cough particular bothersome.  This occurred again recently.  Treated with prednisone again.  Recently placed on Symbicort just a couple weeks ago.  Unsure how much it is helping as again day-to-day outside these episodes or flareups he feels fine.  Most recent chest x-ray 02/23/2022 reviewed interpreted as clear lungs bilaterally with evidence of hyperinflation.  PMH: Tobacco abuse in remission Surgical history: Appendectomy, left shoulder cyst extraction, hernia repair Family history: Father with CAD, CVA Social history: Former smoker, 30-pack-year, quit recently, July 2023 with recent illness  Questionaires / Pulmonary Flowsheets:   ACT:      No data to display          MMRC:     No data to display          [place]:      No data to display          Tests:   FENO:  No results found for: "NITRICOXIDE"  PFT:     No data to display           WALK:      No data to display          Imaging: Personally reviewed and as per EMR discussion in this note DG Chest 2 View  Result Date: 02/23/2022 CLINICAL DATA:  Shortness of breath. COPD exacerbation. Increasing dyspnea. Smoker. EXAM: CHEST - 2 VIEW COMPARISON:  Chest radiograph 10/29/2020. FINDINGS: Chronic hyperinflation. Chronic bronchial thickening but increased from prior exam. No focal airspace disease. No pulmonary mass or visualized nodule. Heart is normal in size. Stable mediastinal contours. No pneumothorax, pneumomediastinum, pleural effusion or pulmonary edema. Blunting of costophrenic angles due to hyperinflation. No acute osseous abnormalities are seen. IMPRESSION: Chronic but increased bronchial thickening consistent with COPD exacerbation. No localizing pulmonary process. Electronically Signed   By: Keith Rake M.D.   On: 02/23/2022 12:46    Lab Results: Personally reviewed CBC    Component Value Date/Time   WBC 6.2 10/25/2021 1543   RBC 5.51 10/25/2021 1543   HGB 16.7 10/25/2021 1543   HCT 51.6 10/25/2021 1543   PLT 244.0 10/25/2021 1543   MCV 93.5 10/25/2021 1543   MCH 30.5 10/29/2020 1350   MCHC 32.4 10/25/2021 1543   RDW 14.3 10/25/2021 1543   LYMPHSABS 1.8 10/25/2021 1543   MONOABS 0.7 10/25/2021 1543   EOSABS  0.5 10/25/2021 1543   BASOSABS 0.1 10/25/2021 1543    BMET    Component Value Date/Time   NA 138 03/03/2022 1122   K 4.5 03/03/2022 1122   CL 100 03/03/2022 1122   CO2 32 03/03/2022 1122   GLUCOSE 94 03/03/2022 1122   BUN 18 03/03/2022 1122   CREATININE 0.96 03/03/2022 1122   CALCIUM 8.7 03/03/2022 1122   GFRNONAA >60 10/29/2020 1350   GFRAA  11/13/2007 1410    >60        The eGFR has been calculated using the MDRD equation. This calculation has not been validated in all clinical    BNP No results found for: "BNP"  ProBNP No results found for: "PROBNP"  Specialty Problems       Pulmonary Problems   COPD  (chronic obstructive pulmonary disease) ([place])    Diagnosis 2022 x-ray- albuterol helps       No Known Allergies  Immunization History  Administered Date(s) Administered   Moderna Sars-Covid-2 Vaccination 06/15/2020, 07/13/2020    Past Medical History:  Diagnosis Date   Adenomatous polyps    Cancer ([place]) 2022   Melanoma-removed   COPD (chronic obstructive pulmonary disease) ([place])    Erectile dysfunction    buys sildenafil from [place] or other- 40 or 35m   Genital herpes    last outbreak 2012 or before   Hyperlipidemia    ON ROSUVASTATIN   Marijuana use    alomst on daily basis   Tobacco abuse    1.5 PPD. uses as a crutch. not ready to quit    Tobacco History: Social History   Tobacco Use  Smoking Status Former   Packs/day: 1.00   Years: 30.00   Total pack years: 30.00   Types: Cigarettes   Quit date: 02/09/2022   Years since quitting: 0.1  Smokeless Tobacco Never  Tobacco Comments   Had a cigarette at 1130 am today 09/02/21   Counseling given: Not Answered Tobacco comments: Had a cigarette at 1130 am today 09/02/21   Continue to not smoke  Outpatient Encounter Medications as of 03/16/2022  Medication Sig   albuterol (VENTOLIN HFA) 108 (90 Base) MCG/ACT inhaler Inhale 2 puffs into the lungs every 4 (four) hours as needed for wheezing or shortness of breath.   budesonide-formoterol (SYMBICORT) 160-4.5 MCG/ACT inhaler Inhale 2 puffs into the lungs in the morning and at bedtime.   clobetasol cream (TEMOVATE) 0.05 % Apply topically.   rosuvastatin (CRESTOR) 10 MG tablet Take 1 tablet (10 mg total) by mouth once a week.   sildenafil (VIAGRA) 50 MG tablet Take 50 mg by mouth daily as needed for erectile dysfunction.   triamcinolone cream (KENALOG) 0.1 % Apply 1 application topically 2 (two) times daily. For 7-10 days maximum   No facility-administered encounter medications on file as of 03/16/2022.     Review of Systems  Review of Systems  No chest pain with  exertion.  No orthopnea or PND.  No lower extremity swelling.  Comprehensive review of systems otherwise negative. Physical Exam  BP 124/68 (BP Location: Right Arm, Patient Position: Sitting, Cuff Size: Normal)   Pulse 64   Temp 98.6 F (37 C) (Oral)   Ht '6\' 2"'  (1.88 m)   SpO2 99%   BMI 26.45 kg/m   Wt Readings from Last 5 Encounters:  03/03/22 206 lb (93.4 kg)  02/07/22 197 lb (89.4 kg)  12/08/21 200 lb (90.7 kg)  11/16/21 200 lb (90.7 kg)  10/25/21 205 lb 6.4  oz (93.2 kg)    BMI Readings from Last 5 Encounters:  03/16/22 26.45 kg/m  03/03/22 26.45 kg/m  02/07/22 25.29 kg/m  12/08/21 25.68 kg/m  11/16/21 25.68 kg/m     Physical Exam General: Well-appearing, no acute distress Eyes: EOMI, no icterus Neck: Supple, no JVP Pulmonary: Clear, normal work of breathing Cardiovascular warm, no edema Abdomen: Nondistended, bowel sounds present MSK: No joint effusion, no synovitis Neuro: Normal gait, no weakness Psych: Normal mood, full affect   Assessment & Plan:   Recurrent bronchitis: Twice in the last few months.  Suspicious for smoking-related lung disease or asthma.  Denies significant respiratory symptoms in between episodes.  Encouraged to continue Symbicort recently prescribed 2 puff twice daily.  Continue rest inhaler.  Consider escalation of inhalers in the future depending on symptom burden, frequency of bronchitis.  Tobacco abuse in remission: We will assess for presence of COPD with PFTs.   Return in about 3 months (around 06/16/2022).   Lanier Clam, MD 03/18/2022

## 2022-05-08 ENCOUNTER — Encounter: Payer: Self-pay | Admitting: *Deleted

## 2022-05-09 DIAGNOSIS — R972 Elevated prostate specific antigen [PSA]: Secondary | ICD-10-CM | POA: Diagnosis not present

## 2022-05-11 DIAGNOSIS — N5201 Erectile dysfunction due to arterial insufficiency: Secondary | ICD-10-CM | POA: Diagnosis not present

## 2022-05-11 DIAGNOSIS — N401 Enlarged prostate with lower urinary tract symptoms: Secondary | ICD-10-CM | POA: Diagnosis not present

## 2022-05-11 DIAGNOSIS — R972 Elevated prostate specific antigen [PSA]: Secondary | ICD-10-CM | POA: Diagnosis not present

## 2022-05-11 DIAGNOSIS — R3915 Urgency of urination: Secondary | ICD-10-CM | POA: Diagnosis not present

## 2022-06-01 DIAGNOSIS — D225 Melanocytic nevi of trunk: Secondary | ICD-10-CM | POA: Diagnosis not present

## 2022-06-01 DIAGNOSIS — X32XXXD Exposure to sunlight, subsequent encounter: Secondary | ICD-10-CM | POA: Diagnosis not present

## 2022-06-01 DIAGNOSIS — Z85820 Personal history of malignant melanoma of skin: Secondary | ICD-10-CM | POA: Diagnosis not present

## 2022-06-01 DIAGNOSIS — Z08 Encounter for follow-up examination after completed treatment for malignant neoplasm: Secondary | ICD-10-CM | POA: Diagnosis not present

## 2022-06-01 DIAGNOSIS — Z1283 Encounter for screening for malignant neoplasm of skin: Secondary | ICD-10-CM | POA: Diagnosis not present

## 2022-06-01 DIAGNOSIS — L570 Actinic keratosis: Secondary | ICD-10-CM | POA: Diagnosis not present

## 2022-07-27 ENCOUNTER — Encounter: Payer: Self-pay | Admitting: *Deleted

## 2022-08-31 DIAGNOSIS — Z08 Encounter for follow-up examination after completed treatment for malignant neoplasm: Secondary | ICD-10-CM | POA: Diagnosis not present

## 2022-08-31 DIAGNOSIS — D225 Melanocytic nevi of trunk: Secondary | ICD-10-CM | POA: Diagnosis not present

## 2022-08-31 DIAGNOSIS — Z85820 Personal history of malignant melanoma of skin: Secondary | ICD-10-CM | POA: Diagnosis not present

## 2022-08-31 DIAGNOSIS — Z1283 Encounter for screening for malignant neoplasm of skin: Secondary | ICD-10-CM | POA: Diagnosis not present

## 2022-09-14 ENCOUNTER — Telehealth: Payer: Self-pay | Admitting: Family Medicine

## 2022-09-14 NOTE — Telephone Encounter (Signed)
Caller states they faxed over [place] request forms for Symbicort on 09/13/22. Caller requests this to be completed on behalf of patient as soon as possible.

## 2022-09-22 ENCOUNTER — Other Ambulatory Visit (HOSPITAL_COMMUNITY): Payer: Self-pay

## 2022-09-26 ENCOUNTER — Other Ambulatory Visit (HOSPITAL_COMMUNITY): Payer: Self-pay

## 2022-09-28 ENCOUNTER — Other Ambulatory Visit (HOSPITAL_COMMUNITY): Payer: Self-pay

## 2022-09-28 NOTE — Telephone Encounter (Signed)
Generic is fine-I do not think I signed it as dispense as written so he should be able to use the generic already sent in correct?

## 2022-09-28 NOTE — Telephone Encounter (Signed)
See below

## 2022-09-28 NOTE — Telephone Encounter (Signed)
Is pt able to use generic?  Per testclaim generic Symbicort is covered:

## 2022-10-04 ENCOUNTER — Other Ambulatory Visit (HOSPITAL_COMMUNITY): Payer: Self-pay

## 2022-10-24 ENCOUNTER — Telehealth: Payer: Self-pay | Admitting: Family Medicine

## 2022-10-24 NOTE — Telephone Encounter (Signed)
See below

## 2022-10-24 NOTE — Telephone Encounter (Signed)
Pt states the RX  budesonide-formoterol (SYMBICORT) 160-4.5 MCG/ACT inhaler  Was not put in correctly and his insurance wont pay for it. Please call [phone] - RX advance.

## 2022-10-25 ENCOUNTER — Other Ambulatory Visit (HOSPITAL_COMMUNITY): Payer: Self-pay

## 2022-10-27 ENCOUNTER — Encounter: Payer: Self-pay | Admitting: Family Medicine

## 2022-10-27 ENCOUNTER — Ambulatory Visit (INDEPENDENT_AMBULATORY_CARE_PROVIDER_SITE_OTHER): Payer: PPO | Admitting: Family Medicine

## 2022-10-27 ENCOUNTER — Telehealth: Payer: Self-pay | Admitting: Family Medicine

## 2022-10-27 VITALS — BP 100/62 | HR 84 | Temp 98.3°F | Ht 74.0 in | Wt 211.4 lb

## 2022-10-27 DIAGNOSIS — R351 Nocturia: Secondary | ICD-10-CM | POA: Diagnosis not present

## 2022-10-27 DIAGNOSIS — J441 Chronic obstructive pulmonary disease with (acute) exacerbation: Secondary | ICD-10-CM

## 2022-10-27 DIAGNOSIS — N401 Enlarged prostate with lower urinary tract symptoms: Secondary | ICD-10-CM

## 2022-10-27 DIAGNOSIS — I700 Atherosclerosis of aorta: Secondary | ICD-10-CM

## 2022-10-27 LAB — POC COVID19 BINAXNOW: SARS Coronavirus 2 Ag: POSITIVE — AB

## 2022-10-27 MED ORDER — NIRMATRELVIR/RITONAVIR (PAXLOVID)TABLET: 3.0000 | ORAL_TABLET | Freq: Two times a day (BID) | ORAL | 0 refills | Status: AC

## 2022-10-27 MED ORDER — PREDNISONE 50 MG PO TABS: 50.0000 mg | ORAL_TABLET | Freq: Every day | ORAL | 0 refills | Status: DC

## 2022-10-27 MED ORDER — FLUTICASONE-SALMETEROL 500-50 MCG/ACT IN AEPB
1.0000 | INHALATION_SPRAY | Freq: Two times a day (BID) | RESPIRATORY_TRACT | 11 refills | Status: DC
Start: 2022-10-27 — End: 2023-05-01

## 2022-10-27 MED ORDER — ALBUTEROL SULFATE HFA 108 (90 BASE) MCG/ACT IN AERS: 2.0000 | INHALATION_SPRAY | RESPIRATORY_TRACT | 5 refills | Status: DC | PRN

## 2022-10-27 NOTE — Telephone Encounter (Signed)
Triage nurse called back and states pt refused ED care. Wants either Dr Yong Channel or his MA to call him back. Please advise.

## 2022-10-27 NOTE — Progress Notes (Signed)
Phone [phone] In person visit   Subjective:   Keith Curtis is a 72 y.o. year old very pleasant male patient who presents for/with See problem oriented charting Chief Complaint  Patient presents with   Wheezing    Pt c/o wheezing and labored breathing x2 days, wants prednisone.    Past Medical History-  Patient Active Problem List   Diagnosis Date Noted   COPD (chronic obstructive pulmonary disease) ([place]) 04/25/2021    Priority: High   Tobacco dependence     Priority: High   BPH associated with nocturia 10/27/2022    Priority: Medium    History of melanoma 10/25/2021    Priority: Medium    Aortic atherosclerosis ([place]) 04/30/2021    Priority: Medium    Hypertriglyceridemia 03/25/2018    Priority: Medium    Lipoma of left shoulder 09/01/2016    Priority: Medium    Marijuana use     Priority: Medium    Knee osteoarthritis 09/01/2016    Priority: Low   Genital herpes     Priority: Low   Erectile dysfunction     Priority: Low   Varicose veins of both lower extremities 10/15/2018    Medications- reviewed and updated Current Outpatient Medications  Medication Sig Dispense Refill   clobetasol cream (TEMOVATE) 0.05 % Apply topically.     fluticasone-salmeterol (WIXELA INHUB) 500-50 MCG/ACT AEPB Inhale 1 puff into the lungs in the morning and at bedtime. 60 each 11   nirmatrelvir/ritonavir (PAXLOVID) 20 x 150 MG & 10 x 100MG  TABS Take 3 tablets by mouth 2 (two) times daily for 5 days. (Take nirmatrelvir 150 mg two tablets twice daily for 5 days and ritonavir 100 mg one tablet twice daily for 5 days) Patient GFR is >60 30 tablet 0   predniSONE (DELTASONE) 50 MG tablet Take 1 tablet (50 mg total) by mouth daily with breakfast. 7 tablet 0   sildenafil (VIAGRA) 50 MG tablet Take 50 mg by mouth daily as needed for erectile dysfunction.     triamcinolone cream (KENALOG) 0.1 % Apply 1 application topically 2 (two) times daily. For 7-10 days maximum 30 g 0   albuterol (VENTOLIN  HFA) 108 (90 Base) MCG/ACT inhaler Inhale 2 puffs into the lungs every 4 (four) hours as needed for wheezing or shortness of breath. 1 each 5   No current facility-administered medications for this visit.     Objective:  BP 100/62   Pulse 84   Temp 98.3 F (36.8 C)   Ht 6\' 2"  (1.88 m)   Wt 211 lb 6.4 oz (95.9 kg)   SpO2 91%   BMI 27.14 kg/m  Gen: NAD, resting comfortably, sits up in chair to try to promote movement CV: RRR no murmurs rubs or gallops Lungs: CTAB no crackles, wheeze, rhonchi, prolonged expiratory phase Abdomen: soft/nontender/nondistended/normal bowel sounds. No rebound or guarding.  Ext: trace edema Skin: warm, dry  Son with him today-very supportive and asks helpful questions    Assessment and Plan   # COPD exacerbation S: Patient presents with 4 days of symptoms. Symptoms started with fatigue and shortness of breath with activity- then developed more cough and congestion - some phlegm.  Sick contacts: no known contacts.  -remains cigarette free from last June or July. Does smoke some marijuana- a few weeks ago had more then cut it out for a few weeks and eased back in. Encouraged cessation of marijuana.   Symbicort in January jumped from 45 to $100 then was running  low 2 weeks ago- called [facility] and was told $80- ran out within a few days. Did end up buying this and started back 4 days ago  Symptoms include: Increase in cough Increased shortness of breath increase in sputum production some change in sputum color-more yellow Not a ton of wheeze  Patient has had to use albuterol >10 x per day. No oxygen at home. .   A/P: Patient presents with COPD exacerbation based on wheeze, increased shortness of breath. Will cover with course of prednisone 50 mg x 7 days. Considered antibiotic and opted out- as he also has positive covid test and covid is the likely trigger here.  -has not recently taken his cholesterol medicine- so does not need to hold for  paxlovid -Very limited inhaler options on formulary for COPD with health team advantage-he plans to consider other plans -Discussed hazards of smoking even marijuana and need for long-term cessation-congratulated him on remaining cigarette free  From AVS "  Patient Instructions  HOLD all inhalers except for albuterol (rescue). When you finish paxlovid for covid- finish the symbicort you have and then start [place] as long as wixela is covered  Also start prednisone today  If you have worsening shortness of breath please seek care in emergency room. If your oxygen levels get below 88 or you feel more winded please go to emergency room  You are contagious and likely will be until you have significant improvent in cough/congestion/shortness of breath    Recommended follow up: Return for followup or sooner if needed.Schedule b4 you leave. "  Aortic atherosclerosis ([place]) Despite aortic atherosclerosis and elevated cardiovascular risk patient has stopped his statin-suspect poor control but we opted to focus on acute issue-can we open cholesterol discussion when he schedules follow-up-tolerate poor control for now.  Issue also came out because would need to hold statin for at least 24 hours before starting paxlovid  BPH associated with nocturia Patient follows with urology due to elevated PSA and BPH that is symptomatic.  Mild poor control during COPD exacerbation particularly with increased intra-abdominal pressure from coughing.  Reports trial of Flomax without benefit-was given a different sample but has not tried this yet-he agrees to wait on trying this until after acute illness improved since he is not sure the name of the medication  Recommended follow up: Return for followup or sooner if needed.Schedule b4 you leave. Future Appointments  Date Time Provider [place]  11/09/2022  1:00 PM Marin Olp, MD [facility]  01/04/2023  3:00 PM [facility] ANNUAL WELLNESS VISIT 1 [facility]    Lab/Order associations:   ICD-10-CM   1. COPD exacerbation ([place])  J44.1 POC COVID-19    2. Aortic atherosclerosis ([facility])  I70.0     3. BPH associated with nocturia  N40.1    R35.1       Meds ordered this encounter  Medications   fluticasone-salmeterol (WIXELA INHUB) 500-50 MCG/ACT AEPB    Sig: Inhale 1 puff into the lungs in the morning and at bedtime.    Dispense:  60 each    Refill:  11   predniSONE (DELTASONE) 50 MG tablet    Sig: Take 1 tablet (50 mg total) by mouth daily with breakfast.    Dispense:  7 tablet    Refill:  0   nirmatrelvir/ritonavir (PAXLOVID) 20 x 150 MG & 10 x 100MG  TABS    Sig: Take 3 tablets by mouth 2 (two) times daily for 5 days. (Take nirmatrelvir 150  mg two tablets twice daily for 5 days and ritonavir 100 mg one tablet twice daily for 5 days) Patient GFR is >60    Dispense:  30 tablet    Refill:  0   albuterol (VENTOLIN HFA) 108 (90 Base) MCG/ACT inhaler    Sig: Inhale 2 puffs into the lungs every 4 (four) hours as needed for wheezing or shortness of breath.    Dispense:  1 each    Refill:  5    Time Spent: 45 minutes of total time (4:40 PM-5:25 PM) was spent on the date of the encounter performing the following actions: chart review prior to seeing the patient, obtaining history, performing a medically necessary exam, counseling on the treatment plan and excellent questions from both patient and his son, placing orders, and documenting in our EHR.    Return precautions advised.  Garret Reddish, MD

## 2022-10-27 NOTE — Telephone Encounter (Signed)
Called and spoke with pt and son, pt became irrate and started cursing at me because Dr. Yong Channel can not stop what he is doing to talk with him about the matter at hand. Pt is refusing the  ER and states that the last time this happened Dr. Yong Channel told pt he did not need to go to ER to just call the office and he would speak with him. I advised pt with this being Friday and no openings that the recommendation is the ER or Urgent care for further assessment. Pt still cursed while I was trying to help, I informed pt I would relay this to Dr. Yong Channel and pt stated that was no help. Before hanging up I encouraged pt and son to take pt to be evaluated at ER or Urgent care. Please call and schedule f/u for pt for next week in an available slot.

## 2022-10-27 NOTE — Telephone Encounter (Signed)
See below

## 2022-10-27 NOTE — Telephone Encounter (Signed)
Called and scheduled patient follow up on 10/30/22 @ 4pm. Despite this pt requests pcp send in prednisone in the meantime to help symptoms.

## 2022-10-27 NOTE — Patient Instructions (Addendum)
HOLD all inhalers except for albuterol (rescue). When you finish paxlovid for covid- finish the symbicort you have and then start [place] as long as wixela is covered  Also start prednisone today  If you have worsening shortness of breath please seek care in emergency room. If your oxygen levels get below 88 or you feel more winded please go to emergency room  You are contagious and likely will be until you have significant improvent in cough/congestion/shortness of breath    Recommended follow up: Return for followup or sooner if needed.Schedule b4 you leave.

## 2022-10-27 NOTE — Assessment & Plan Note (Signed)
Despite aortic atherosclerosis and elevated cardiovascular risk patient has stopped his statin-suspect poor control but we opted to focus on acute issue-can we open cholesterol discussion when he schedules follow-up-tolerate poor control for now.  Issue also came out because would need to hold statin for at least 24 hours before starting paxlovid

## 2022-10-27 NOTE — Telephone Encounter (Signed)
Patient's son Clifton James states Patient has been experiencing loud, labored breathing, difficulty catching his breath,wheezing, winded for the last three days.  Transferred to Triage

## 2022-10-27 NOTE — Telephone Encounter (Signed)
When I saw patient at visit- he wanted to pass on apology for how he behaved earlier today- he will try to do better. He understands that I cannot immediately stop and take a phone call for him.

## 2022-10-27 NOTE — Assessment & Plan Note (Signed)
Patient follows with urology due to elevated PSA and BPH that is symptomatic.  Mild poor control during COPD exacerbation particularly with increased intra-abdominal pressure from coughing.  Reports trial of Flomax without benefit-was given a different sample but has not tried this yet-he agrees to wait on trying this until after acute illness improved since he is not sure the name of the medication

## 2022-10-30 ENCOUNTER — Ambulatory Visit: Payer: PPO | Admitting: Family Medicine

## 2022-10-30 ENCOUNTER — Other Ambulatory Visit (HOSPITAL_COMMUNITY): Payer: Self-pay

## 2022-10-30 NOTE — Telephone Encounter (Signed)
Pt was seen on 10/27/22  Patient Name: Keith Curtis Gender: Male DOB: 10-14-1950 Age: 72 Y 3 M 26 D Return Phone Number: [phone] (Primary), [phone] (Secondary) Address: City/ State/ Zip: [place]  [address] Client [place] [place] [facility] [place] [facility] Provider Garret Reddish- MD Contact Type Call Who Is Calling Patient / Member / Family / Caregiver Call Type Triage / Clinical Caller Name Fitzpatrick Vivian Relationship To Patient Son Return Phone Number [phone] (Primary) Chief Complaint BREATHING - fast, heavy or wheezing Reason for Call Symptomatic / Request for Health Information Initial Comment (office transfer)Caller states pt is experiencing wheezing, SOB, for over 3x Translation No Nurse Assessment Nurse: Zenia Resides, RN, Diane Date/Time (Eastern Time): 10/27/2022 11:17:12 AM Confirm and document reason for call. If symptomatic, describe symptoms. ---Caller states pt. has some sob and wheezing. Breathing is labored x 2-3 days. Can only walk a few steps and then has to sit down. Has happened before. Former smoker. COPD dx. Does the patient have any new or worsening symptoms? ---Yes Will a triage be completed? ---Yes Related visit to physician within the last 2 weeks? ---No Does the PT have any chronic conditions? (i.e. diabetes, asthma, this includes High risk factors for pregnancy, etc.) ---Yes List chronic conditions. ---COPD Is this a behavioral health or substance abuse call? ---No Guidelines Guideline Title Affirmed Question Affirmed Notes Nurse Date/Time (Eastern Time) Breathing Difficulty [1] MODERATE difficulty breathing (e.g., speaks in phrases, SOB even at rest, pulse 100-120) AND [2] NEW-onset Zenia Resides, RN, Diane 10/27/2022 11:18:55 AM Guidelines Guideline Title Affirmed Question Affirmed Notes Nurse Date/Time ([place] Time) or WORSE than normal Disp. Time Eilene Ghazi Time)  Disposition Final User 10/27/2022 11:15:42 AM Send to Urgent Queue [facility] List 10/27/2022 11:23:31 AM Go to ED Now Yes Zenia Resides, RN, Diane Final Disposition 10/27/2022 11:23:31 AM Go to ED Now Yes Zenia Resides, RN, Diane Caller Disagree/Comply Disagree Caller Understands Yes PreDisposition Did not know what to do Care Advice Given Per Guideline GO TO ED NOW: * You need to be seen in the Emergency Department. NOTE TO TRIAGER - DRIVING: * Another adult should drive. CARE ADVICE given per Breathing Difficulty (Adult) guideline. CALL EMS 911 IF: * Call EMS if you become worse. Comments User: Hildred Priest, RN Date/Time Eilene Ghazi Time): 10/27/2022 11:20:29 AM Has been out of Cympb[facility]ort User: Hildred Priest, RN Date/Time Eilene Ghazi Time): 10/27/2022 11:25:53 AM Pt. refuses ER. States he wants Prednisone and that he can't afford his Cymbicort ($400) User: Hildred Priest, RN Date/Time Eilene Ghazi Time): 10/27/2022 11:28:05 AM Informed office of ER refusal and pt.'s request Referrals GO TO FACILITY REFUSED

## 2022-11-09 ENCOUNTER — Encounter: Payer: PPO | Admitting: Family Medicine

## 2022-11-20 ENCOUNTER — Other Ambulatory Visit (HOSPITAL_COMMUNITY): Payer: Self-pay

## 2022-11-20 NOTE — Telephone Encounter (Signed)
Generic Symbicort is covered. Copay is $100.00.   Symbicort was changed to [employer].

## 2022-12-13 ENCOUNTER — Ambulatory Visit (INDEPENDENT_AMBULATORY_CARE_PROVIDER_SITE_OTHER): Payer: PPO | Admitting: Family Medicine

## 2022-12-13 ENCOUNTER — Encounter: Payer: Self-pay | Admitting: Family Medicine

## 2022-12-13 VITALS — BP 108/70 | HR 60 | Temp 97.7°F | Ht 74.0 in | Wt 218.4 lb

## 2022-12-13 DIAGNOSIS — Z23 Encounter for immunization: Secondary | ICD-10-CM | POA: Diagnosis not present

## 2022-12-13 DIAGNOSIS — J449 Chronic obstructive pulmonary disease, unspecified: Secondary | ICD-10-CM

## 2022-12-13 DIAGNOSIS — Z0000 Encounter for general adult medical examination without abnormal findings: Secondary | ICD-10-CM

## 2022-12-13 DIAGNOSIS — Z87891 Personal history of nicotine dependence: Secondary | ICD-10-CM

## 2022-12-13 DIAGNOSIS — E781 Pure hyperglyceridemia: Secondary | ICD-10-CM | POA: Diagnosis not present

## 2022-12-13 LAB — LIPID PANEL
Cholesterol: 157 mg/dL (ref 0–200)
HDL: 57.7 mg/dL (ref >39.00)
LDL Cholesterol: 77 mg/dL (ref 0–99)
NonHDL: 99.27
Total CHOL/HDL Ratio: 3
Triglycerides: 112 mg/dL (ref 0.0–149.0)
VLDL: 22.4 mg/dL (ref 0.0–40.0)

## 2022-12-13 LAB — COMPREHENSIVE METABOLIC PANEL
ALT: 19 U/L (ref 0–53)
AST: 27 U/L (ref 0–37)
Albumin: 3.8 g/dL (ref 3.5–5.2)
Alkaline Phosphatase: 69 U/L (ref 39–117)
BUN: 18 mg/dL (ref 6–23)
CO2: 26 mEq/L (ref 19–32)
Calcium: 8.9 mg/dL (ref 8.4–10.5)
Chloride: 103 mEq/L (ref 96–112)
Creatinine, Ser: 0.97 mg/dL (ref 0.40–1.50)
GFR: 78.02 mL/min (ref >60.00)
Glucose, Bld: 94 mg/dL (ref 70–99)
Potassium: 5 mEq/L (ref 3.5–5.1)
Sodium: 138 mEq/L (ref 135–145)
Total Bilirubin: 0.6 mg/dL (ref 0.2–1.2)
Total Protein: 6.4 g/dL (ref 6.0–8.3)

## 2022-12-13 LAB — CBC WITH DIFFERENTIAL/PLATELET
Basophils Absolute: 0 10*3/uL (ref 0.0–0.1)
Basophils Relative: 0.7 % (ref 0.0–3.0)
Eosinophils Absolute: 0.4 10*3/uL (ref 0.0–0.7)
Eosinophils Relative: 6.2 % — ABNORMAL HIGH (ref 0.0–5.0)
HCT: 47.1 % (ref 39.0–52.0)
Hemoglobin: 15.7 g/dL (ref 13.0–17.0)
Lymphocytes Relative: 19.4 % (ref 12.0–46.0)
Lymphs Abs: 1.2 10*3/uL (ref 0.7–4.0)
MCHC: 33.3 g/dL (ref 30.0–36.0)
MCV: 92.3 fl (ref 78.0–100.0)
Monocytes Absolute: 0.7 10*3/uL (ref 0.1–1.0)
Monocytes Relative: 11.5 % (ref 3.0–12.0)
Neutro Abs: 3.9 10*3/uL (ref 1.4–7.7)
Neutrophils Relative %: 62.2 % (ref 43.0–77.0)
Platelets: 196 10*3/uL (ref 150.0–400.0)
RBC: 5.1 Mil/uL (ref 4.22–5.81)
RDW: 14.9 % (ref 11.5–15.5)
WBC: 6.3 10*3/uL (ref 4.0–10.5)

## 2022-12-13 MED ORDER — PREDNISONE 50 MG PO TABS: 50.0000 mg | ORAL_TABLET | Freq: Every day | ORAL | 0 refills | Status: DC

## 2022-12-13 NOTE — Addendum Note (Signed)
Addended by: Gwenette Greet on: 12/13/2022 11:32 AM   Modules accepted: Orders

## 2022-12-13 NOTE — Patient Instructions (Addendum)
Prevnar 20 today  Recommend tetanus at pharmacy  -very responsive to prednisone- will give 1x fill of this to have on hand BUT also want him to let me know if he starts to have to use that so we can evaluate him- also would want him to take a COVID test at home if feeling bad enough to use that  -encouraged quit marijuana but glad still cigarette free for sure  -please call to schedule follow up with urology  We have placed a referral for you today to lung cancer screening program. In some cases you will see # listed below- you can call this if you have not heard within a week. If you do not see # listed- you should receive a mychart message or phone call within a week with the # to call.    Please stop by lab before you go If you have mychart- we will send your results within 3 business days of [place] receiving them.  If you do not have mychart- we will call you about results within 5 business days of [place] receiving them.  *please also note that you will see labs on mychart as soon as they post. I will later go in and write notes on them- will say "notes from Dr. Durene Cal"

## 2022-12-13 NOTE — Progress Notes (Signed)
Phone: [phone]   Subjective:  Patient presents today for their annual physical. Chief complaint-noted.   See problem oriented charting- ROS- full  review of systems was completed and negative  except for: fatigue, cough, shortness of breath per baseline, urinary frequency without burning, almost bruise like rash on forearms that comes and goes- plans to discuss with dermatology- we will check complete blood count- reports mild itchiness   The following were reviewed and entered/updated in epic: Past Medical History:  Diagnosis Date   Adenomatous polyps    Cancer (HCC) 2022   Melanoma-removed   COPD (chronic obstructive pulmonary disease) (HCC)    Erectile dysfunction    buys sildenafil from [place] or other- 40 or 60mg    Genital herpes    last outbreak 2012 or before   Hyperlipidemia    ON ROSUVASTATIN   Marijuana use    alomst on daily basis   Tobacco abuse    1.5 PPD. uses as a crutch. not ready to quit   Patient Active Problem List   Diagnosis Date Noted   COPD (chronic obstructive pulmonary disease) (HCC) 04/25/2021    Priority: High   Tobacco dependence     Priority: High   BPH associated with nocturia 10/27/2022    Priority: Medium    History of melanoma 10/25/2021    Priority: Medium    Aortic atherosclerosis (HCC) 04/30/2021    Priority: Medium    Hypertriglyceridemia 03/25/2018    Priority: Medium    Lipoma of left shoulder 09/01/2016    Priority: Medium    Marijuana use     Priority: Medium    Knee osteoarthritis 09/01/2016    Priority: Low   Genital herpes     Priority: Low   Erectile dysfunction     Priority: Low   Varicose veins of both lower extremities 10/15/2018   Past Surgical History:  Procedure Laterality Date   APPENDECTOMY  1975   COLONOSCOPY  09/02/2021   CYST EXCISION     left shoulder blade   hernia bilateral     inguinal- both sides- 3 surgeries total. around 2009 last surgery.    POLYPECTOMY     TA + 09-02-2021    Family  History  Problem Relation Age of Onset   Other Mother        died in 23s- unknown cause   Heart attack Father        died at age 73   Stroke Father        68   Colon polyps Maternal Uncle    Colon cancer Neg Hx    Esophageal cancer Neg Hx    Stomach cancer Neg Hx    Rectal cancer Neg Hx     Medications- reviewed and updated Current Outpatient Medications  Medication Sig Dispense Refill   albuterol (VENTOLIN HFA) 108 (90 Base) MCG/ACT inhaler Inhale 2 puffs into the lungs every 4 (four) hours as needed for wheezing or shortness of breath. 1 each 5   clobetasol cream (TEMOVATE) 0.05 % Apply topically.     fluticasone-salmeterol (WIXELA INHUB) 500-50 MCG/ACT AEPB Inhale 1 puff into the lungs in the morning and at bedtime. 60 each 11   sildenafil (VIAGRA) 50 MG tablet Take 50 mg by mouth daily as needed for erectile dysfunction.     triamcinolone cream (KENALOG) 0.1 % Apply 1 application topically 2 (two) times daily. For 7-10 days maximum 30 g 0   predniSONE (DELTASONE) 50 MG tablet Take 1 tablet (50 mg  total) by mouth daily with breakfast. 7 tablet 0   No current facility-administered medications for this visit.    Allergies-reviewed and updated No Known Allergies  Social History   Social History Narrative   Divorced. GF 10 years in 2018. Son from prior marriage. No grandkids.       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       HObbies: time with gf, playing guitar, [place] with GF. Enjoys hiking.    Objective  Objective:  BP 108/70   Pulse 60   Temp 97.7 F (36.5 C)   Ht 6\' 2"  (1.88 m)   Wt 218 lb 6.4 oz (99.1 kg)   SpO2 94%   BMI 28.04 kg/m  Gen: NAD, resting comfortably HEENT: Mucous membranes are moist. Oropharynx normal Neck: no thyromegaly CV: RRR no murmurs rubs or gallops Lungs: CTAB no crackles, wheeze, rhonchi Abdomen: soft/nontender/nondistended/normal bowel sounds. No rebound  or guarding.  Ext: no edema Skin: warm, dry, bruising like appearance on bilateral forearms primarily- do not see on other area outside of lower and upper arms Neuro: grossly normal, moves all extremities, PERRLA   Assessment and Plan  72 y.o. male presenting for annual physical.  Health Maintenance counseling: 1. Anticipatory guidance: Patient counseled regarding regular dental exams -q6 months, eye exams -yearly advised but no recent issues,  avoiding smoking and second hand smoke , limiting alcohol to 2 beverages per day - about 2-3 a day or 14-21 a week- advised cutting back max 14 a week and even lower than that would be good for weight los, no illicit drugs - other than marijuana- encouraged cessation.   2. Risk factor reduction:  Advised patient of need for regular exercise and diet rich and fruits and vegetables to reduce risk of heart attack and stroke.  Exercise- not recently- encouraged to restart- goal 150 minutes a week.  Diet/weight management-weight up 13 pounds in the last year- advised no further weight gain and perhaps mild loss.  Wt Readings from Last 3 Encounters:  12/13/22 218 lb 6.4 oz (99.1 kg)  10/27/22 211 lb 6.4 oz (95.9 kg)  03/03/22 206 lb (93.4 kg)  3. Immunizations/screenings/ancillary studies- advised Tetanus, Diphtheria, and Pertussis (Tdap) at pharmacy, just had COVID so wait until fall for next vaccine , opts in prevnar 20  Immunization History  Administered Date(s) Administered   [employer] Vaccination 06/15/2020, 07/13/2020  4. Prostate cancer screening- follows closely with urology for PSA's- last seen in September and will call to schedule Lab Results  Component Value Date   PSA 5.97 (H) 10/25/2021   PSA 3.05 04/28/2021   5. Colon cancer screening - April 2023 with plan for 3-year repeat 6. Skin cancer screening-follows with Dr. Margo Aye with melanoma history. advised regular sunscreen use. Denies worrisome, changing, or new skin lesions- other  than bruising like rash  7. Smoking associated screening (lung cancer screening, AAA screen 65-75, UA)-former smoker- but still using marijuana.  Recommended full cessation.  Urinalysis done with urology.  Discussed lung cancer screening (35 pack years at least)- opts in this year 8. STD screening - monogamous with long term girlfriend   Status of chronic or acute concerns   # COPD-patient using Wixela and reports seems to be helpful  -mild lingering congestion but almost his baseline- light yellow sputum.  -Had flaring symptoms with COVID in March -very responsive to prednisone- will give 1x fill  of this to have on hand BUT also want him to let me know if he starts to have to use that so we can evaluate him- also would want him to take a COVID test at home if feeling bad enough to use that -encouraged quit marijuana but glad still cigarette free for sure  #hyperlipidemia/hypertriglyceridemia-but improved last visit on statin which she later stopped # Aortic atherosclerosis S: Medication: None at present  - once a week in past Lab Results  Component Value Date   CHOL 161 04/28/2021   HDL 61.70 04/28/2021   LDLCALC 73 04/28/2021   LDLDIRECT 66.0 03/03/2022   TRIG 134.0 04/28/2021   CHOLHDL 3 04/28/2021   A/P: update lipidas and reconsider statin depending on levels Aortic atherosclerosis (presumed stable)- LDL goal ideally <70    Recommended follow up: Return in about 6 months (around 06/15/2023) for followup or sooner if needed.Schedule b4 you leave. Future Appointments  Date Time Provider Department Center  01/04/2023  3:00 PM [facility] ANNUAL WELLNESS VISIT 1 [facility]   Lab/Order associations: fasting   ICD-10-CM   1. Preventative health care  Z00.00     2. Chronic obstructive pulmonary disease, unspecified COPD type (HCC)  J44.9     3. Hypertriglyceridemia  E78.1 CBC with Differential/Platelet    Comprehensive metabolic panel    Lipid panel    4. Former smoker  Z87.891  Ambulatory Referral for Lung Cancer Scre      Meds ordered this encounter  Medications   predniSONE (DELTASONE) 50 MG tablet    Sig: Take 1 tablet (50 mg total) by mouth daily with breakfast.    Dispense:  7 tablet    Refill:  0    Return precautions advised.  Tana Conch, MD

## 2023-01-04 ENCOUNTER — Ambulatory Visit (INDEPENDENT_AMBULATORY_CARE_PROVIDER_SITE_OTHER): Payer: PPO

## 2023-01-04 VITALS — Wt 218.0 lb

## 2023-01-04 DIAGNOSIS — Z0000 Encounter for general adult medical examination without abnormal findings: Secondary | ICD-10-CM | POA: Diagnosis not present

## 2023-01-04 NOTE — Progress Notes (Signed)
I connected with  Keith Curtis on 01/04/23 by a audio enabled telemedicine application and verified that I am speaking with the correct person using two identifiers.  Patient Location: Home  Provider Location: Office/Clinic  I discussed the limitations of evaluation and management by telemedicine. The patient expressed understanding and agreed to proceed.   Subjective:   Keith Curtis is a 72 y.o. male who presents for Medicare Annual/Subsequent preventive examination.  Review of Systems     Cardiac Risk Factors include: advanced age (>14men, >78 women);male gender     Objective:    Today's Vitals   01/04/23 1235  Weight: 218 lb (98.9 kg)   Body mass index is 27.99 kg/m.     01/04/2023   12:38 PM 12/29/2021    1:49 PM 10/29/2020    1:49 PM 04/01/2020    1:52 PM 03/26/2019    3:16 PM 11/27/2017    2:26 PM  Advanced Directives  Does Patient Have a Medical Advance Directive? Yes Yes No No No No  Type of Estate agent of [place];Living will Healthcare Power of [place];Living will      Copy of Healthcare Power of Attorney in Chart? No - copy requested No - copy requested      Would patient like information on creating a medical advance directive?    Yes (MAU/Ambulatory/Procedural Areas - Information given) No - Patient declined Yes (MAU/Ambulatory/Procedural Areas - Information given)    Current Medications (verified) Outpatient Encounter Medications as of 01/04/2023  Medication Sig   albuterol (VENTOLIN HFA) 108 (90 Base) MCG/ACT inhaler Inhale 2 puffs into the lungs every 4 (four) hours as needed for wheezing or shortness of breath.   clobetasol cream (TEMOVATE) 0.05 % Apply topically.   fluticasone-salmeterol (WIXELA INHUB) 500-50 MCG/ACT AEPB Inhale 1 puff into the lungs in the morning and at bedtime.   predniSONE (DELTASONE) 50 MG tablet Take 1 tablet (50 mg total) by mouth daily with breakfast.   sildenafil (VIAGRA) 50 MG tablet Take 20 mg by mouth  daily as needed for erectile dysfunction.   triamcinolone cream (KENALOG) 0.1 % Apply 1 application topically 2 (two) times daily. For 7-10 days maximum   No facility-administered encounter medications on file as of 01/04/2023.    Allergies (verified) Patient has no known allergies.   History: Past Medical History:  Diagnosis Date   Adenomatous polyps    Cancer (HCC) 2022   Melanoma-removed   COPD (chronic obstructive pulmonary disease) (HCC)    Erectile dysfunction    buys sildenafil from [place] or other- 40 or 60mg    Genital herpes    last outbreak 2012 or before   Hyperlipidemia    ON ROSUVASTATIN   Marijuana use    alomst on daily basis   Tobacco abuse    1.5 PPD. uses as a crutch. not ready to quit   Past Surgical History:  Procedure Laterality Date   APPENDECTOMY  1975   COLONOSCOPY  09/02/2021   CYST EXCISION     left shoulder blade   hernia bilateral     inguinal- both sides- 3 surgeries total. around 2009 last surgery.    POLYPECTOMY     TA + 09-02-2021   Family History  Problem Relation Age of Onset   Other Mother        died in 84s- unknown cause   Heart attack Father        died at age 54   Stroke Father  59   Colon polyps Maternal Uncle    Colon cancer Neg Hx    Esophageal cancer Neg Hx    Stomach cancer Neg Hx    Rectal cancer Neg Hx    Social History   Socioeconomic History   Marital status: Divorced    Spouse name: Not on file   Number of children: Not on file   Years of education: Not on file   Highest education level: Not on file  Occupational History   Occupation: Advertising account planner  Tobacco Use   Smoking status: Former    Packs/day: 1.00    Years: 30.00    Additional pack years: 0.00    Total pack years: 30.00    Types: Cigarettes    Quit date: 02/09/2022    Years since quitting: 0.9   Smokeless tobacco: Never   Tobacco comments:    Had a cigarette at 1130 am today 09/02/21  Vaping Use   Vaping Use: Never used   Substance and Sexual Activity   Alcohol use: Yes    Alcohol/week: 4.0 standard drinks of alcohol    Types: 4 Standard drinks or equivalent per week    Comment: occassional   Drug use: Not Currently    Frequency: 7.0 times per week    Types: Marijuana    Comment: marijuana use daily   Sexual activity: Yes    Comment: GF already has genital herpes  Other Topics Concern   Not on file  Social History Narrative   Divorced. GF 10 years in 2018. Son from prior marriage. No grandkids.       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       HObbies: time with gf, playing guitar, time in [place]- [place] with GF. Enjoys hiking.    Social Determinants of Health   Financial Resource Strain: Low Risk  (01/04/2023)   Overall Financial Resource Strain (CARDIA)    Difficulty of Paying Living Expenses: Not hard at all  Food Insecurity: No Food Insecurity (01/04/2023)   Hunger Vital Sign    Worried About Running Out of Food in the Last Year: Never true    Ran Out of Food in the Last Year: Never true  Transportation Needs: No Transportation Needs (01/04/2023)   PRAPARE - Administrator, Civil Service (Medical): No    Lack of Transportation (Non-Medical): No  Physical Activity: Inactive (01/04/2023)   Exercise Vital Sign    Days of Exercise per Week: 0 days    Minutes of Exercise per Session: 0 min  Stress: No Stress Concern Present (01/04/2023)   [employer] of Occupational Health - Occupational Stress Questionnaire    Feeling of Stress : Not at all  Social Connections: Socially Isolated (01/04/2023)   Social Connection and Isolation Panel [NHANES]    Frequency of Communication with Friends and Family: More than three times a week    Frequency of Social Gatherings with Friends and Family: More than three times a week    Attends Religious Services: Never    Database administrator or Organizations: No    Attends Tax inspector Meetings: Never    Marital Status: Divorced    Tobacco Counseling Counseling given: Not Answered Tobacco comments: Had a cigarette at 1130 am today 09/02/21   Clinical Intake:  Pre-visit preparation completed: Yes  Pain : No/denies pain     BMI - recorded: 27.99 Nutritional Status:  BMI 25 -29 Overweight Nutritional Risks: None Diabetes: No  How often do you need to have someone help you when you read instructions, pamphlets, or other written materials from your doctor or pharmacy?: 1 - Never  Diabetic?no  Interpreter Needed?: No  Information entered by :: Lanier Ensign, LPN   Activities of Daily Living    01/04/2023   12:40 PM  In your present state of health, do you have any difficulty performing the following activities:  Hearing? 0  Vision? 0  Difficulty concentrating or making decisions? 0  Walking or climbing stairs? 0  Dressing or bathing? 0  Doing errands, shopping? 0  Preparing Food and eating ? N  Using the Toilet? N  In the past six months, have you accidently leaked urine? N  Do you have problems with loss of bowel control? N  Managing your Medications? N  Managing your Finances? N  Housekeeping or managing your Housekeeping? N    Patient Care Team: Shelva Majestic, MD as PCP - General (Family Medicine)  Indicate any recent Medical Services you may have received from other than [facility] providers in the past year (date may be approximate).     Assessment:   This is a routine wellness examination for [place].  Hearing/Vision screen Hearing Screening - Comments:: Pt stated slight loss  Vision Screening - Comments:: Pt encouraged to follow up with provider   Dietary issues and exercise activities discussed: Current Exercise Habits: The patient has a physically strenuous job, but has no regular exercise apart from work.   Goals Addressed             This Visit's Progress    Patient Stated       None at this time         Depression Screen    01/04/2023   12:38 PM 12/29/2021    1:47 PM 04/25/2021    2:21 PM 04/01/2020    1:49 PM 03/26/2019    3:16 PM 11/27/2017    2:31 PM 09/25/2017    2:31 PM  PHQ 2/9 Scores  PHQ - 2 Score 0 1 1 0 0 0 0  PHQ- 9 Score  1 2   0     Fall Risk    01/04/2023   12:40 PM 12/29/2021    1:51 PM 04/25/2021    2:20 PM 04/01/2020    1:54 PM 03/26/2019    3:16 PM  Fall Risk   Falls in the past year? 0 0 0 0 0  Number falls in past yr: 0 0 0 0 0  Injury with Fall? 0 0 0 0 0  Risk for fall due to : Impaired vision Impaired vision No Fall Risks Impaired vision   Follow up Falls prevention discussed Falls prevention discussed Falls evaluation completed Falls prevention discussed Education provided    FALL RISK PREVENTION PERTAINING TO THE HOME:  Any stairs in or around the home? Yes  If so, are there any without handrails? No  Home free of loose throw rugs in walkways, pet beds, electrical cords, etc? Yes  Adequate lighting in your home to reduce risk of falls? Yes   ASSISTIVE DEVICES UTILIZED TO PREVENT FALLS:  Life alert? No  Use of a cane, walker or w/c? No  Grab bars in the bathroom? Yes  Shower chair or bench in shower? Yes  Elevated toilet seat or a handicapped toilet? No   TIMED UP AND GO:  Was the test performed? No .  Cognitive Function:        01/04/2023   12:41 PM 12/29/2021    1:49 PM 04/01/2020    1:57 PM  6CIT Screen  What Year? 0 points 0 points 0 points  What month? 0 points 0 points 0 points  What time? 0 points 0 points   Count back from 20 0 points 0 points 0 points  Months in reverse 0 points 0 points 0 points  Repeat phrase 0 points 0 points 0 points  Total Score 0 points 0 points     Immunizations Immunization History  Administered Date(s) Administered   Moderna Sars-Covid-2 Vaccination 06/15/2020, 07/13/2020   PNEUMOCOCCAL CONJUGATE-20 12/13/2022      Flu Vaccine status: Declined, Education has been provided regarding the  importance of this vaccine but patient still declined. Advised may receive this vaccine at local pharmacy or Health Dept. Aware to provide a copy of the vaccination record if obtained from local pharmacy or Health Dept. Verbalized acceptance and understanding.  Pneumococcal vaccine status: Declined,  Education has been provided regarding the importance of this vaccine but patient still declined. Advised may receive this vaccine at local pharmacy or Health Dept. Aware to provide a copy of the vaccination record if obtained from local pharmacy or Health Dept. Verbalized acceptance and understanding.   Covid-19 vaccine status: Completed vaccines  Qualifies for Shingles Vaccine? No   Zostavax completed No   Shingrix Completed?: No.    Education has been provided regarding the importance of this vaccine. Patient has been advised to call insurance company to determine out of pocket expense if they have not yet received this vaccine. Advised may also receive vaccine at local pharmacy or Health Dept. Verbalized acceptance and understanding.  Screening Tests Health Maintenance  Topic Date Due   Lung Cancer Screening  Never done   COVID-19 Vaccine (3 - 2023-24 season) 04/14/2022   INFLUENZA VACCINE  03/15/2023   Medicare Annual Wellness (AWV)  01/04/2024   COLONOSCOPY (Pts 45-84yrs Insurance coverage will need to be confirmed)  12/08/2024   Pneumonia Vaccine 22+ Years old  Completed   Hepatitis C Screening  Completed   HPV VACCINES  Aged Out   DTaP/Tdap/Td  Discontinued   Zoster Vaccines- Shingrix  Discontinued    Health Maintenance  Health Maintenance Due  Topic Date Due   Lung Cancer Screening  Never done   COVID-19 Vaccine (3 - 2023-24 season) 04/14/2022    Colorectal cancer screening: Type of screening: Colonoscopy. Completed 12/08/21. Repeat every 3 years  Lung Cancer Screening: (Low Dose CT Chest recommended if Age 54-80 years, 30 pack-year currently smoking OR have quit w/in 15years.)  does qualify.   Lung Cancer Screening Referral: ordered 12/13/22  Additional Screening:  Hepatitis C Screening:  Completed 09/25/17  Vision Screening: Recommended annual ophthalmology exams for early detection of glaucoma and other disorders of the eye. Is the patient up to date with their annual eye exam?  No  Who is the provider or what is the name of the office in which the patient attends annual eye exams? Encouraged to follow up with provider  If pt is not established with a provider, would they like to be referred to a provider to establish care? No .   Dental Screening: Recommended annual dental exams for proper oral hygiene  Community Resource Referral / Chronic Care Management: CRR required this visit?  No   CCM required this visit?  No      Plan:     I  have personally reviewed and noted the following in the patient's chart:   Medical and social history Use of alcohol, tobacco or illicit drugs  Current medications and supplements including opioid prescriptions. Patient is not currently taking opioid prescriptions. Functional ability and status Nutritional status Physical activity Advanced directives List of other physicians Hospitalizations, surgeries, and ER visits in previous 12 months Vitals Screenings to include cognitive, depression, and falls Referrals and appointments  In addition, I have reviewed and discussed with patient certain preventive protocols, quality metrics, and best practice recommendations. A written personalized care plan for preventive services as well as general preventive health recommendations were provided to patient.     Marzella Schlein, LPN   1/61/0960   Nurse Notes: none

## 2023-01-04 NOTE — Patient Instructions (Signed)
Keith Curtis , Thank you for taking time to come for your Medicare Wellness Visit. I appreciate your ongoing commitment to your health goals. Please review the following plan we discussed and let me know if I can assist you in the future.   These are the goals we discussed:  Goals      Complete colonoscopy prior to August     Patient Stated     None at this time     Patient Stated     None at this time      Patient Stated     None at this time         This is a list of the screening recommended for you and due dates:  Health Maintenance  Topic Date Due   Screening for Lung Cancer  Never done   COVID-19 Vaccine (3 - 2023-24 season) 04/14/2022   Flu Shot  03/15/2023   Medicare Annual Wellness Visit  01/04/2024   Colon Cancer Screening  12/08/2024   Pneumonia Vaccine  Completed   Hepatitis C Screening: USPSTF Recommendation to screen - Ages 18-79 yo.  Completed   HPV Vaccine  Aged Out   DTaP/Tdap/Td vaccine  Discontinued   Zoster (Shingles) Vaccine  Discontinued    Advanced directives: Please bring a copy of your health care power of attorney and living will to the office at your convenience.  Conditions/risks identified: none at this time   Next appointment: Follow up in one year for your annual wellness visit.   Preventive Care 85 Years and Older, Male  Preventive care refers to lifestyle choices and visits with your health care provider that can promote health and wellness. What does preventive care include? A yearly physical exam. This is also called an annual well check. Dental exams once or twice a year. Routine eye exams. Ask your health care provider how often you should have your eyes checked. Personal lifestyle choices, including: Daily care of your teeth and gums. Regular physical activity. Eating a healthy diet. Avoiding tobacco and drug use. Limiting alcohol use. Practicing safe sex. Taking low doses of aspirin every day. Taking vitamin and mineral  supplements as recommended by your health care provider. What happens during an annual well check? The services and screenings done by your health care provider during your annual well check will depend on your age, overall health, lifestyle risk factors, and family history of disease. Counseling  Your health care provider may ask you questions about your: Alcohol use. Tobacco use. Drug use. Emotional well-being. Home and relationship well-being. Sexual activity. Eating habits. History of falls. Memory and ability to understand (cognition). Work and work Astronomer. Screening  You may have the following tests or measurements: Height, weight, and BMI. Blood pressure. Lipid and cholesterol levels. These may be checked every 5 years, or more frequently if you are over 63 years old. Skin check. Lung cancer screening. You may have this screening every year starting at age 30 if you have a 30-pack-year history of smoking and currently smoke or have quit within the past 15 years. Fecal occult blood test (FOBT) of the stool. You may have this test every year starting at age 12. Flexible sigmoidoscopy or colonoscopy. You may have a sigmoidoscopy every 5 years or a colonoscopy every 10 years starting at age 51. Prostate cancer screening. Recommendations will vary depending on your family history and other risks. Hepatitis C blood test. Hepatitis B blood test. Sexually transmitted disease (STD) testing. Diabetes screening. This is  done by checking your blood sugar (glucose) after you have not eaten for a while (fasting). You may have this done every 1-3 years. Abdominal aortic aneurysm (AAA) screening. You may need this if you are a current or former smoker. Osteoporosis. You may be screened starting at age 40 if you are at high risk. Talk with your health care provider about your test results, treatment options, and if necessary, the need for more tests. Vaccines  Your health care provider  may recommend certain vaccines, such as: Influenza vaccine. This is recommended every year. Tetanus, diphtheria, and acellular pertussis (Tdap, Td) vaccine. You may need a Td booster every 10 years. Zoster vaccine. You may need this after age 69. Pneumococcal 13-valent conjugate (PCV13) vaccine. One dose is recommended after age 54. Pneumococcal polysaccharide (PPSV23) vaccine. One dose is recommended after age 1. Talk to your health care provider about which screenings and vaccines you need and how often you need them. This information is not intended to replace advice given to you by your health care provider. Make sure you discuss any questions you have with your health care provider. Document Released: 08/27/2015 Document Revised: 04/19/2016 Document Reviewed: 06/01/2015 Elsevier Interactive Patient Education  2017 [employer].  Fall Prevention in the Home Falls can cause injuries. They can happen to people of all ages. There are many things you can do to make your home safe and to help prevent falls. What can I do on the outside of my home? Regularly fix the edges of walkways and driveways and fix any cracks. Remove anything that might make you trip as you walk through a door, such as a raised step or threshold. Trim any bushes or trees on the path to your home. Use bright outdoor lighting. Clear any walking paths of anything that might make someone trip, such as rocks or tools. Regularly check to see if handrails are loose or broken. Make sure that both sides of any steps have handrails. Any raised decks and porches should have guardrails on the edges. Have any leaves, snow, or ice cleared regularly. Use sand or salt on walking paths during winter. Clean up any spills in your garage right away. This includes oil or grease spills. What can I do in the bathroom? Use night lights. Install grab bars by the toilet and in the tub and shower. Do not use towel bars as grab bars. Use  non-skid mats or decals in the tub or shower. If you need to sit down in the shower, use a plastic, non-slip stool. Keep the floor dry. Clean up any water that spills on the floor as soon as it happens. Remove soap buildup in the tub or shower regularly. Attach bath mats securely with double-sided non-slip rug tape. Do not have throw rugs and other things on the floor that can make you trip. What can I do in the bedroom? Use night lights. Make sure that you have a light by your bed that is easy to reach. Do not use any sheets or blankets that are too big for your bed. They should not hang down onto the floor. Have a firm chair that has side arms. You can use this for support while you get dressed. Do not have throw rugs and other things on the floor that can make you trip. What can I do in the kitchen? Clean up any spills right away. Avoid walking on wet floors. Keep items that you use a lot in easy-to-reach places. If you need  to reach something above you, use a strong step stool that has a grab bar. Keep electrical cords out of the way. Do not use floor polish or wax that makes floors slippery. If you must use wax, use non-skid floor wax. Do not have throw rugs and other things on the floor that can make you trip. What can I do with my stairs? Do not leave any items on the stairs. Make sure that there are handrails on both sides of the stairs and use them. Fix handrails that are broken or loose. Make sure that handrails are as long as the stairways. Check any carpeting to make sure that it is firmly attached to the stairs. Fix any carpet that is loose or worn. Avoid having throw rugs at the top or bottom of the stairs. If you do have throw rugs, attach them to the floor with carpet tape. Make sure that you have a light switch at the top of the stairs and the bottom of the stairs. If you do not have them, ask someone to add them for you. What else can I do to help prevent falls? Wear  shoes that: Do not have high heels. Have rubber bottoms. Are comfortable and fit you well. Are closed at the toe. Do not wear sandals. If you use a stepladder: Make sure that it is fully opened. Do not climb a closed stepladder. Make sure that both sides of the stepladder are locked into place. Ask someone to hold it for you, if possible. Clearly mark and make sure that you can see: Any grab bars or handrails. First and last steps. Where the edge of each step is. Use tools that help you move around (mobility aids) if they are needed. These include: Canes. Walkers. Scooters. Crutches. Turn on the lights when you go into a dark area. Replace any light bulbs as soon as they burn out. Set up your furniture so you have a clear path. Avoid moving your furniture around. If any of your floors are uneven, fix them. If there are any pets around you, be aware of where they are. Review your medicines with your doctor. Some medicines can make you feel dizzy. This can increase your chance of falling. Ask your doctor what other things that you can do to help prevent falls. This information is not intended to replace advice given to you by your health care provider. Make sure you discuss any questions you have with your health care provider. Document Released: 05/27/2009 Document Revised: 01/06/2016 Document Reviewed: 09/04/2014 Elsevier Interactive Patient Education  2017 [employer].

## 2023-01-10 ENCOUNTER — Telehealth: Payer: Self-pay

## 2023-01-10 NOTE — Telephone Encounter (Signed)
From last result note "Your cholesterol is only mildly elevated with bad cholesterol at 77 and ideal goal under 70-we can certainly restart the once weekly cholesterol medicine if you are interested-just let me know-I would lean toward restarting given your smoking history which increases cardiovascular risk "  I am happy for him to restart if he is willing-please call to check and see if he is willing and send  in if so

## 2023-01-11 MED ORDER — ROSUVASTATIN CALCIUM 10 MG PO TABS: 10.0000 mg | ORAL_TABLET | Freq: Every day | ORAL | 3 refills | Status: DC

## 2023-01-11 NOTE — Telephone Encounter (Signed)
Called and spoke with pt and Rosuvastatin sent to [facility].

## 2023-01-26 ENCOUNTER — Other Ambulatory Visit: Payer: Self-pay

## 2023-01-26 DIAGNOSIS — Z122 Encounter for screening for malignant neoplasm of respiratory organs: Secondary | ICD-10-CM

## 2023-01-26 DIAGNOSIS — Z87891 Personal history of nicotine dependence: Secondary | ICD-10-CM

## 2023-02-19 DIAGNOSIS — X32XXXD Exposure to sunlight, subsequent encounter: Secondary | ICD-10-CM | POA: Diagnosis not present

## 2023-02-19 DIAGNOSIS — D225 Melanocytic nevi of trunk: Secondary | ICD-10-CM | POA: Diagnosis not present

## 2023-02-19 DIAGNOSIS — Z85820 Personal history of malignant melanoma of skin: Secondary | ICD-10-CM | POA: Diagnosis not present

## 2023-02-19 DIAGNOSIS — L821 Other seborrheic keratosis: Secondary | ICD-10-CM | POA: Diagnosis not present

## 2023-02-19 DIAGNOSIS — Z1283 Encounter for screening for malignant neoplasm of skin: Secondary | ICD-10-CM | POA: Diagnosis not present

## 2023-02-19 DIAGNOSIS — L308 Other specified dermatitis: Secondary | ICD-10-CM | POA: Diagnosis not present

## 2023-02-19 DIAGNOSIS — Z08 Encounter for follow-up examination after completed treatment for malignant neoplasm: Secondary | ICD-10-CM | POA: Diagnosis not present

## 2023-02-19 DIAGNOSIS — L570 Actinic keratosis: Secondary | ICD-10-CM | POA: Diagnosis not present

## 2023-02-27 ENCOUNTER — Telehealth: Payer: Self-pay | Admitting: *Deleted

## 2023-02-27 ENCOUNTER — Ambulatory Visit (HOSPITAL_COMMUNITY): Payer: PPO

## 2023-02-27 ENCOUNTER — Ambulatory Visit (INDEPENDENT_AMBULATORY_CARE_PROVIDER_SITE_OTHER): Payer: PPO | Admitting: Physician Assistant

## 2023-02-27 ENCOUNTER — Encounter: Payer: Self-pay | Admitting: Physician Assistant

## 2023-02-27 DIAGNOSIS — Z87891 Personal history of nicotine dependence: Secondary | ICD-10-CM | POA: Diagnosis not present

## 2023-02-27 NOTE — Patient Instructions (Signed)

## 2023-02-27 NOTE — Progress Notes (Signed)
Virtual Visit via Telephone Note  I connected with Keith Curtis on 02/27/23 at 10:48 AM  by telephone and verified that I am speaking with the correct person using two identifiers.  Location: Patient: home Provider: working virtually from home   I discussed the limitations, risks, security and privacy concerns of performing an evaluation and management service by telephone and the availability of in person appointments. I also discussed with the patient that there may be a patient responsible charge related to this service. The patient expressed understanding and agreed to proceed.       Shared Decision Making Visit Lung Cancer Screening Program [phone])   Eligibility: Age 72 Pack Years Smoking History Calculation 71 (# packs/per year x # years smoked) Recent History of coughing up blood  no Unexplained weight loss? No ( >Than 15 pounds within the last 6 months ) Prior History Lung / other cancer No (Diagnosis within the last 5 years already requiring surveillance chest CT Scans). Smoking Status Former Smoker Former Smokers: Years since quit: 1 year  Quit Date: 2023  Visit Components: Discussion included one or more decision making aids? Yes Discussion included risk/benefits of screening. Yes Discussion included potential follow up diagnostic testing for abnormal scans. Yes Discussion included meaning and risk of over diagnosis. Yes Discussion included meaning and risk of False Positives. Yes Discussion included meaning of total radiation exposure. Yes  Counseling Included: Importance of adherence to annual lung cancer LDCT screening. Yes Impact of comorbidities on ability to participate in the program. Yes Ability and willingness to under diagnostic treatment. Yes  Smoking Cessation Counseling: Former Smokers:  Discussed the importance of maintaining cigarette abstinence. Yes Diagnosis Code: Personal History of Nicotine Dependence. A21.308 Information about tobacco  cessation classes and interventions provided to patient. Yes Written Order for Lung Cancer Screening with LDCT placed in Epic. Yes (CT Chest Lung Cancer Screening Low Dose W/O CM) [id number] Z12.2-Screening of respiratory organs Z87.891-Personal history of nicotine dependence    I spent 25 minutes of face to face time/virtual visit time  with the patient discussing the risks and benefits of lung cancer screening. We took the time to pause at intervals to allow for questions to be asked and answered to ensure understanding. We discussed that they had taken the single most powerful action possible to decrease their risk of developing lung cancer when they quit smoking. I counseled them to remain smoke free, and to contact the office if they ever had the desire to smoke again so that we can provide resources and tools to help support the effort to remain smoke free. We discussed the time and location of the scan, and they  will receive a call or letter with the results within  24-72 hours of receiving them. They have the office contact information in the event they have questions.   They verbalized understanding of all of the above and had no further questions.    I explained to the patient that there has been a high incidence of coronary artery disease noted on these exams. I explained that this is a non-gated exam therefore degree or severity cannot be determined. This patient is on statin therapy. I have asked the patient to follow-up with their PCP regarding any incidental finding of coronary artery disease and management with diet or medication as they feel is clinically indicated. The patient verbalized understanding of the above and had no further questions.      Keith Gasman [name], PA-C

## 2023-02-27 NOTE — Telephone Encounter (Signed)
Spoke with pt. He wants to cancel his appt for today for lung cancer screening CT. I offered to reschedule his appt . Pt wants to wait for now. I made sure he has our direct call back number to call when he is ready to reschedule.

## 2023-03-13 ENCOUNTER — Other Ambulatory Visit: Payer: Self-pay | Admitting: Family Medicine

## 2023-04-17 IMAGING — US US ABDOMINAL AORTA SCREENING AAA
1 series · 8 of 8 positions shown · non-contrast
Comparison: None.

CLINICAL DATA: Male between 65-75 years of age with a smoking
history.

EXAM:
US ABDOMINAL AORTA MEDICARE SCREENING
TECHNIQUE: Ultrasound examination of the abdominal aorta was performed as a
screening evaluation for abdominal aortic aneurysm.

[Series 1: us abdominal aorta screening aaa · 0.22mm/px · 8 of 8 slices shown]
[im 1/8]
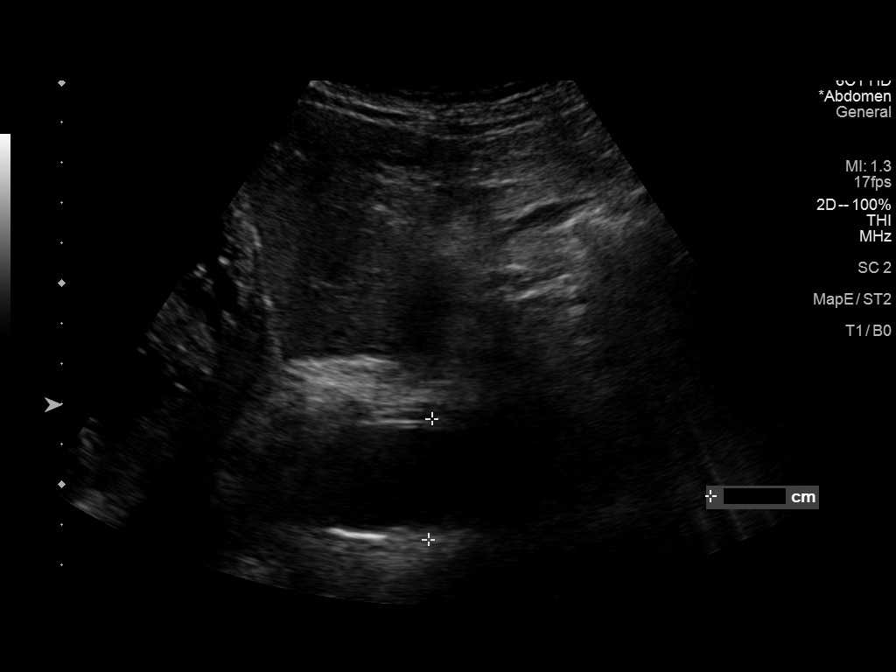
[im 2/8]
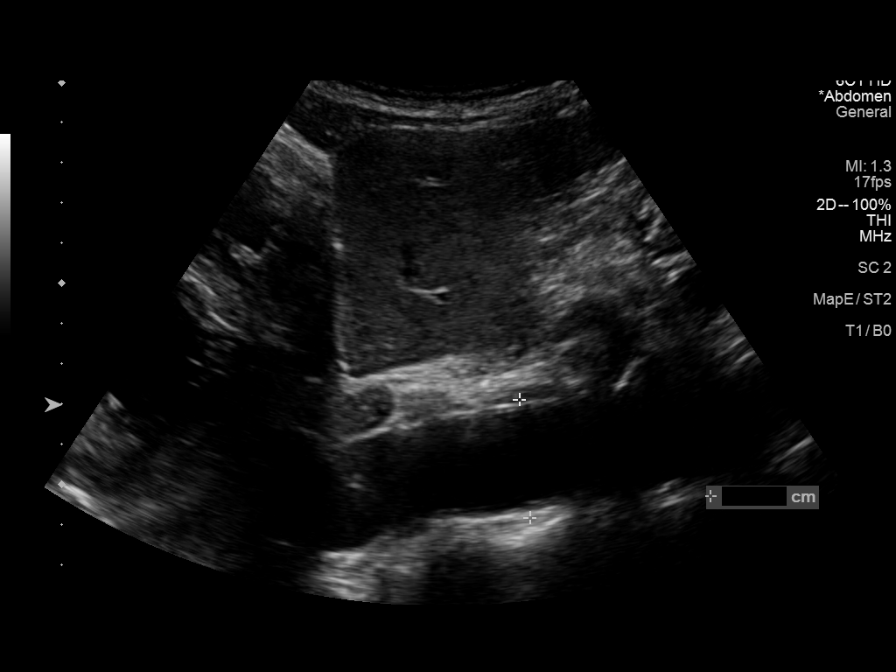
[im 3/8]
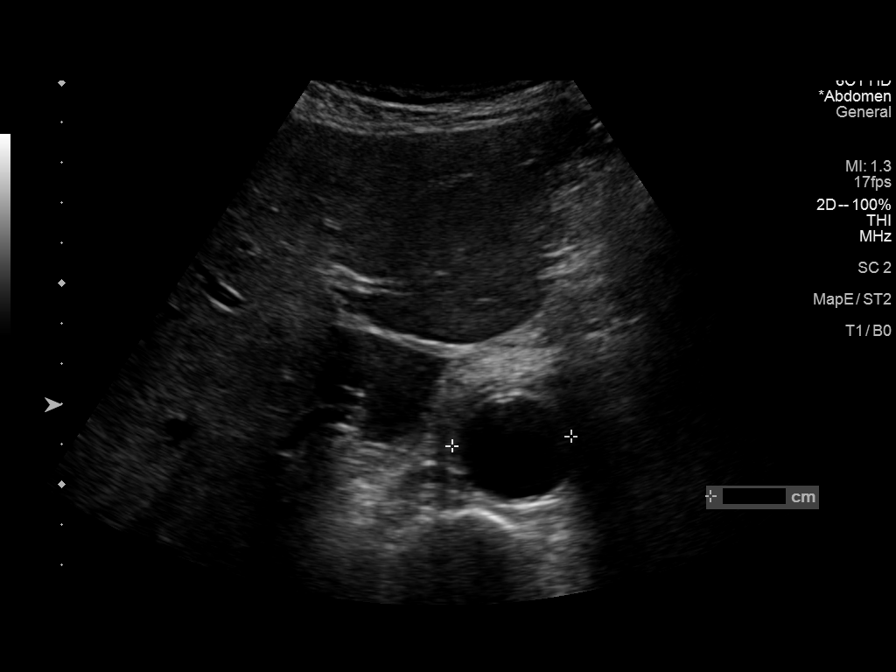
[im 4/8]
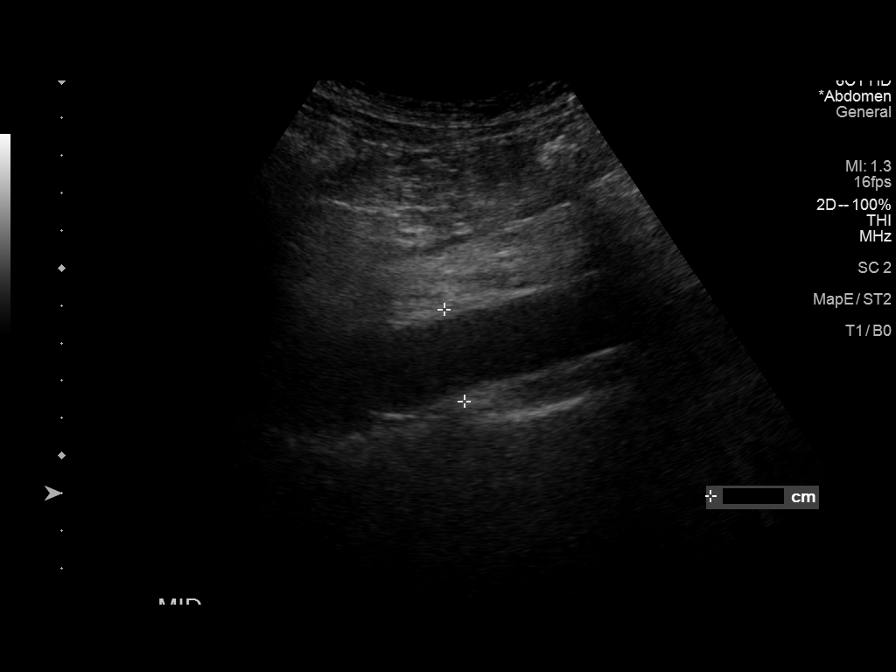
[im 5/8]
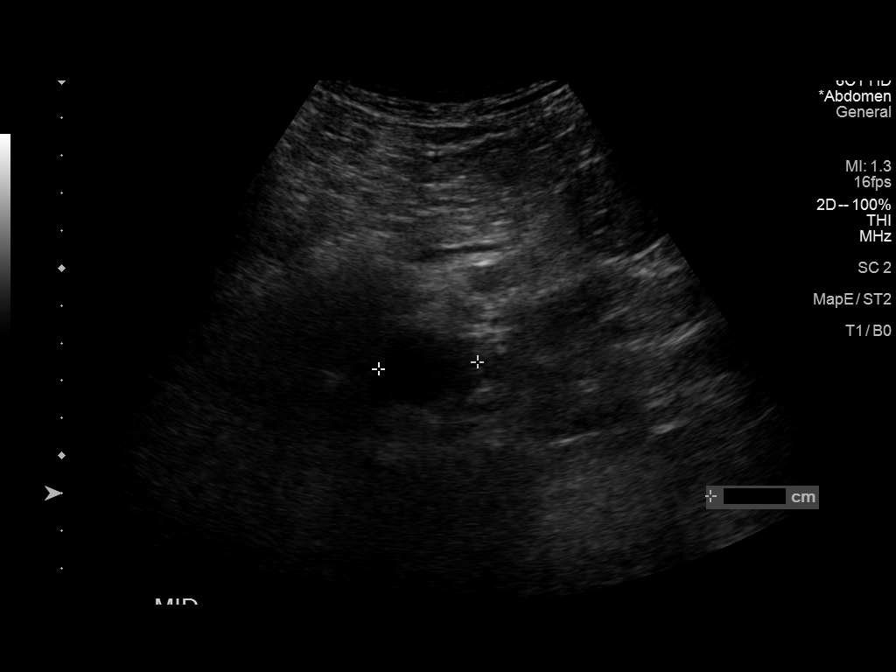
[im 6/8]
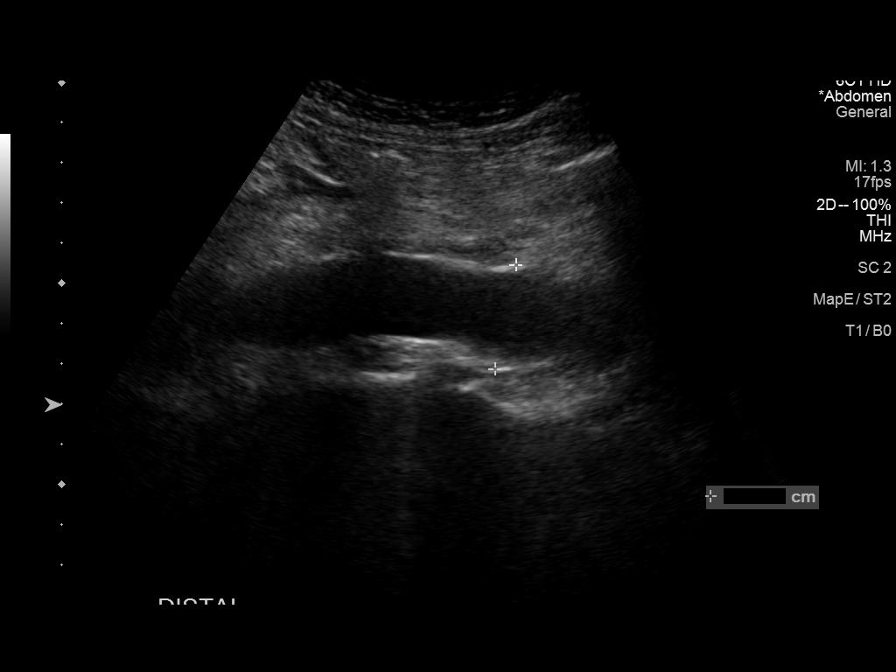
[im 7/8]
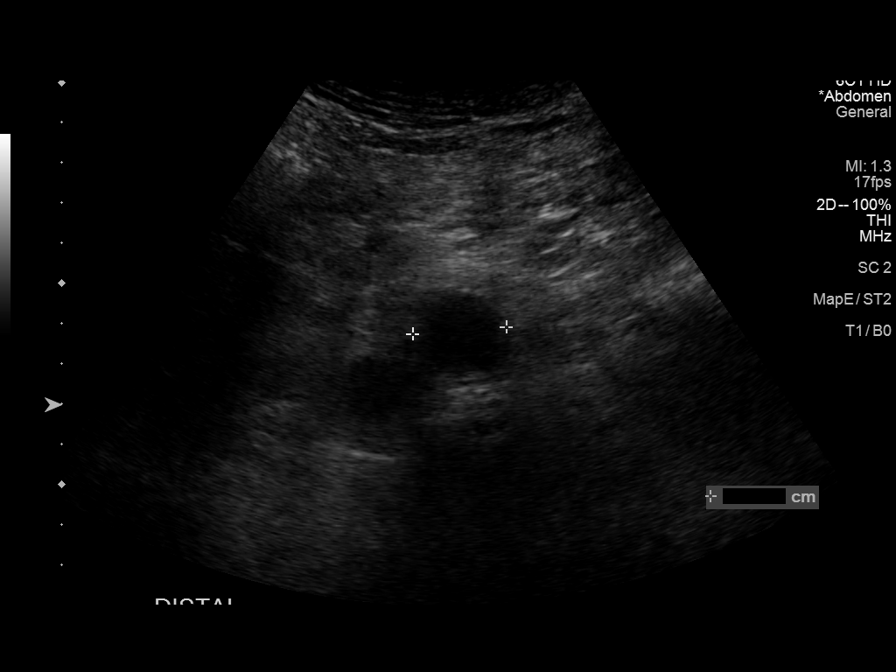
[im 8/8]
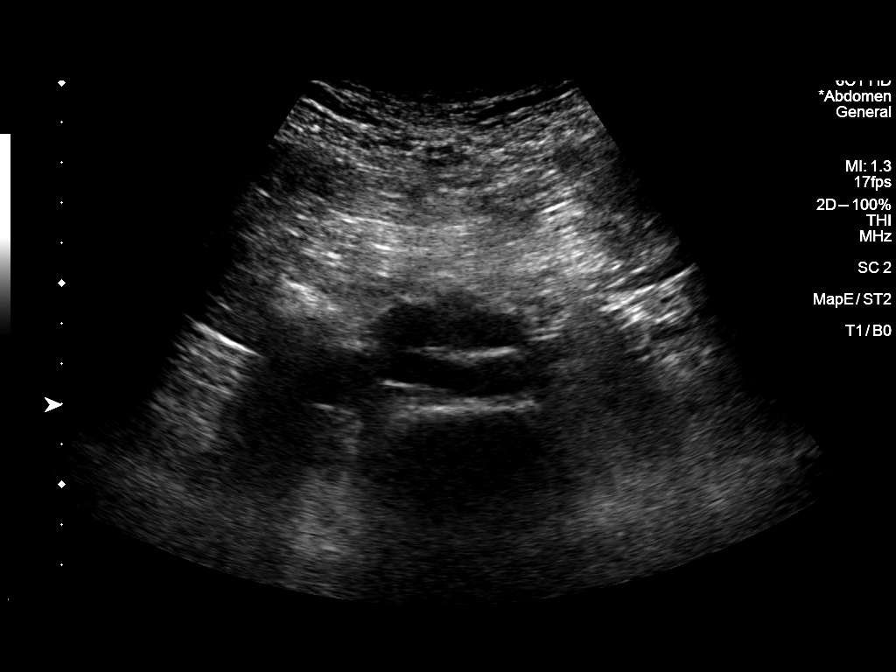

[8 of 8 positions shown; findings below may reference images not displayed]

FINDINGS: Abdominal aortic measurements as follows:

Proximal:  3.0 x 3.0 cm

Mid:  2.5 x 2.6 cm

Distal:  2.3 x 2.6 cm

Calcified plaque present in the abdominal aorta.
IMPRESSION: Aortic atherosclerosis and top-normal caliber of the proximal
abdominal aorta without overt aneurysmal disease.

## 2023-05-01 ENCOUNTER — Ambulatory Visit (INDEPENDENT_AMBULATORY_CARE_PROVIDER_SITE_OTHER): Payer: PPO | Admitting: Family Medicine

## 2023-05-01 ENCOUNTER — Encounter: Payer: Self-pay | Admitting: Family Medicine

## 2023-05-01 VITALS — BP 114/80 | HR 83 | Temp 98.0°F | Resp 92 | Wt 208.8 lb

## 2023-05-01 DIAGNOSIS — E785 Hyperlipidemia, unspecified: Secondary | ICD-10-CM

## 2023-05-01 DIAGNOSIS — H9193 Unspecified hearing loss, bilateral: Secondary | ICD-10-CM

## 2023-05-01 DIAGNOSIS — J449 Chronic obstructive pulmonary disease, unspecified: Secondary | ICD-10-CM | POA: Diagnosis not present

## 2023-05-01 MED ORDER — ROSUVASTATIN CALCIUM 10 MG PO TABS: 10.0000 mg | ORAL_TABLET | ORAL | 3 refills | Status: DC

## 2023-05-01 MED ORDER — TRELEGY ELLIPTA 200-62.5-25 MCG/ACT IN AEPB: 1.0000 | INHALATION_SPRAY | Freq: Every day | RESPIRATORY_TRACT | 11 refills | Status: DC

## 2023-05-01 NOTE — Patient Instructions (Addendum)
COPD appears to be more persistent/bothersome. No chest pain to indicate cardiac disease- think first step is to refer to pulmonology as well as advance inhaler from wixela to Trelegy to see if this helps as long as insurance covers -thrilled he has quit marijuana- encouraged long term cessation  We have placed a referral for you today to pulmonology and hearing loss. In some cases you will see # listed below- you can call this if you have not heard within a week. If you do not see # listed- you should receive a mychart message or phone call within a week with the # to call directly- call that as soon as you get it. If you are having issues getting scheduled reach out to [place] again.    Recommended follow up: Return in about 6 months (around 10/29/2023) for followup or sooner if needed.Schedule b4 you leave.

## 2023-05-01 NOTE — Progress Notes (Signed)
Phone [phone] In person visit   Subjective:   Keith Curtis is a 72 y.o. year old very pleasant male patient who presents for/with See problem oriented charting Chief Complaint  Patient presents with   Referral    P/o would like referral to Pulmonologist and discuss Wixela.   Past Medical History-  Patient Active Problem List   Diagnosis Date Noted   COPD (chronic obstructive pulmonary disease) (HCC) 04/25/2021    Priority: High   Tobacco dependence     Priority: High   BPH associated with nocturia 10/27/2022    Priority: Medium    History of melanoma 10/25/2021    Priority: Medium    Aortic atherosclerosis (HCC) 04/30/2021    Priority: Medium    Hypertriglyceridemia 03/25/2018    Priority: Medium    Lipoma of left shoulder 09/01/2016    Priority: Medium    Marijuana use     Priority: Medium    Knee osteoarthritis 09/01/2016    Priority: Low   Genital herpes     Priority: Low   Erectile dysfunction     Priority: Low   Varicose veins of both lower extremities 10/15/2018    Medications- reviewed and updated Current Outpatient Medications  Medication Sig Dispense Refill   albuterol (VENTOLIN HFA) 108 (90 Base) MCG/ACT inhaler INHALE 2 PUFFS INTO THE LUNGS EVERY 4 HOURS AS NEEDED FOR WHEEZING OR SHORTNESS OF BREATH 6.7 g 3   clobetasol cream (TEMOVATE) 0.05 % Apply topically.     fluticasone-salmeterol (WIXELA INHUB) 500-50 MCG/ACT AEPB Inhale 1 puff into the lungs in the morning and at bedtime. 60 each 11   rosuvastatin (CRESTOR) 10 MG tablet Take 1 tablet (10 mg total) by mouth daily. 90 tablet 3   sildenafil (VIAGRA) 50 MG tablet Take 20 mg by mouth daily as needed for erectile dysfunction.     triamcinolone cream (KENALOG) 0.1 % Apply 1 application topically 2 (two) times daily. For 7-10 days maximum 30 g 0   predniSONE (DELTASONE) 50 MG tablet Take 1 tablet (50 mg total) by mouth daily with breakfast. (Patient not taking: Reported on 05/01/2023) 7 tablet 0   No  current facility-administered medications for this visit.     Objective:  BP 114/80   Pulse 83   Temp 98 F (36.7 C)   Resp (!) 92   Wt 208 lb 12.8 oz (94.7 kg)   BMI 26.81 kg/m  Gen: NAD, resting comfortably CV: RRR no murmurs rubs or gallops Lungs: scattered diffuse wheeze and can hear some audible wheeze in room with him at times Abdomen: soft/nontender/nondistended/normal bowel sounds. No rebound or guarding.  Ext: trace edema Skin: warm, dry     Assessment and Plan   # COPD S: At last visit patient reported using Wixela 500-50 mcg twice daily and seemed to be helpful at first - has been needing albuterol up to 15x a week  -He is continue to smoke marijuana and have continued to encourage cessation.  Cigarette free at least. -He has been very responsive to prednisone in the past when needed -He canceled his lung cancer screening appointment for phone call 02/27/2023 -feels less energy and shortness of breath with activity -coughing fits at time - a few days quit marijuana -has not had to use prednisone -denies chest pain  A/P: COPD appears to be more persistent/bothersome (I can hear him wheezing today). No chest pain to indicate cardiac disease- think first step is to refer to pulmonology as well as advance  inhaler from wixela to Trelegy to see if this helps as long as insurance covers -thrilled he has quit marijuana- encouraged long term cessation  #hyperlipidemia # Aortic atherosclerosis S: Medication:none  -On last visit LDL was above 70 and recommended restart rosuvastatin just once a week which she had previously stopped  Lab Results  Component Value Date   CHOL 157 12/13/2022   HDL 57.70 12/13/2022   LDLCALC 77 12/13/2022   LDLDIRECT 66.0 03/03/2022   TRIG 112.0 12/13/2022   CHOLHDL 3 12/13/2022   A/P: #s are not that elevated  but with aortic atherosclerosis prefer under 70 for Low Density Lipoprotein (LDL cholesterol) - he agrees to restart trial of the 10  mg weekly   #Itchy skin- working with Dr. Margo Aye on this  #BPH- working with Dr. Benancio Deeds- symptoms got better and he didn't have to start sample he was given. Follows with urology  #hearing loss- really notes issues In restaurant- ok one to one. Not interested in audiology at the moment   Recommended follow up: Return for next already scheduled visit or sooner if needed. Future Appointments  Date Time Provider Department Center  06/21/2023 10:20 AM Shelva Majestic, MD [facility]  01/10/2024 11:00 AM [facility] ANNUAL WELLNESS VISIT 1 [facility]   Lab/Order associations:   ICD-10-CM   1. Chronic obstructive pulmonary disease, unspecified COPD type (HCC)  J44.9 Ambulatory referral to Pulmonology    2. Bilateral hearing loss, unspecified hearing loss type  H91.93 Ambulatory referral to Audiology    3. Hyperlipidemia, unspecified hyperlipidemia type  E78.5       Meds ordered this encounter  Medications   Fluticasone-Umeclidin-Vilant (TRELEGY ELLIPTA) 200-62.5-25 MCG/ACT AEPB    Sig: Inhale 1 Inhalation into the lungs daily.    Dispense:  30 each    Refill:  11   rosuvastatin (CRESTOR) 10 MG tablet    Sig: Take 1 tablet (10 mg total) by mouth once a week.    Dispense:  13 tablet    Refill:  3    Return precautions advised.  Tana Conch, MD

## 2023-05-02 ENCOUNTER — Ambulatory Visit: Payer: PPO | Admitting: Family

## 2023-05-29 ENCOUNTER — Ambulatory Visit: Payer: PPO | Attending: Family Medicine | Admitting: Audiologist

## 2023-05-29 DIAGNOSIS — H903 Sensorineural hearing loss, bilateral: Secondary | ICD-10-CM | POA: Diagnosis not present

## 2023-05-29 NOTE — Procedures (Signed)
Outpatient Audiology and [facility] [address] [place], [place]  [address] [phone]  AUDIOLOGICAL  EVALUATION  NAME: Keith Curtis     DOB:   24-Jan-1951      MRN: [id number]                                                                                     DATE: 05/29/2023     REFERENT: Shelva Majestic, MD STATUS: Outpatient DIAGNOSIS: Sensorineural hearing loss bilateral  History: Thackery was seen for an audiological evaluation due to difficulty hearing in background noise, like when eating at a restaurant.  He denies difficulty hearing at any other time.  He feels he can hear well as long as he sees people.  He denies any pain pressure or tinnitus in either ear.  He denies any history of noise exposure.  His father had hearing loss and started wearing hearing aids in his 26s.  Medical history shows no condition with a risk of hearing loss.  Evaluation:  Otoscopy showed a clear view of the tympanic membranes, bilaterally Tympanometry results were consistent with normal hyper compliant middle ear function bilaterally, type Ad Audiometric testing was completed using Conventional Audiometry techniques with insert earphones and TDH headphones. Test results are consistent with normal hearing at 250 and 500 Hz sloping to a profound sensorineural hearing loss by 8 kHz bilaterally. Speech Recognition Thresholds were obtained at 35 dB HL in the right ear and at 30 dB HL in the left ear. Word Recognition Testing was completed at 40 dB SL, Eulice scored 88% in the right ear and 96% in the left ear.  Results:  The test results were reviewed with Azucena Kuba. Jyrell has a severe high-frequency hearing loss.  He has lost all access to high-frequency consonants such as /s/ and /p/.  The reason he hears better one-on-one is because he can see people's face and read their lips.  He feels he does not notice the hearing loss on a daily basis.  Hearing aids were discussed but he is not interested in  pursuing aids at this time.   Recommendations: 1.   Hearing aids are recommended for both years due to severe high-frequency sensorineural hearing loss bilaterally, patient not ready for hearing aids so recommend annual audiometric testing to measure loss for progression.  32 minutes spent testing and counseling on results.   If you have any questions please feel free to contact me at [phone].  Ammie Ferrier Audiologist, Au.D., CCC-A 05/29/2023  1:33 PM  Cc: Shelva Majestic, MD

## 2023-06-05 ENCOUNTER — Telehealth: Payer: Self-pay | Admitting: Family Medicine

## 2023-06-05 NOTE — Telephone Encounter (Signed)
Patient states he wanted PCP to know that changing to the wixela inhaler made a great improvement.

## 2023-06-06 NOTE — Telephone Encounter (Signed)
Fantastic news- please tell him thank you for update

## 2023-06-18 DIAGNOSIS — Z1283 Encounter for screening for malignant neoplasm of skin: Secondary | ICD-10-CM | POA: Diagnosis not present

## 2023-06-18 DIAGNOSIS — Z85820 Personal history of malignant melanoma of skin: Secondary | ICD-10-CM | POA: Diagnosis not present

## 2023-06-18 DIAGNOSIS — Z08 Encounter for follow-up examination after completed treatment for malignant neoplasm: Secondary | ICD-10-CM | POA: Diagnosis not present

## 2023-06-18 DIAGNOSIS — L308 Other specified dermatitis: Secondary | ICD-10-CM | POA: Diagnosis not present

## 2023-06-18 DIAGNOSIS — X32XXXD Exposure to sunlight, subsequent encounter: Secondary | ICD-10-CM | POA: Diagnosis not present

## 2023-06-18 DIAGNOSIS — R208 Other disturbances of skin sensation: Secondary | ICD-10-CM | POA: Diagnosis not present

## 2023-06-18 DIAGNOSIS — L570 Actinic keratosis: Secondary | ICD-10-CM | POA: Diagnosis not present

## 2023-06-21 ENCOUNTER — Ambulatory Visit: Payer: PPO | Admitting: Family Medicine

## 2023-08-06 ENCOUNTER — Encounter: Payer: Self-pay | Admitting: Pulmonary Disease

## 2023-08-06 ENCOUNTER — Ambulatory Visit: Payer: PPO | Admitting: Pulmonary Disease

## 2023-08-06 VITALS — BP 109/71 | HR 80 | Ht 73.0 in | Wt 225.6 lb

## 2023-08-06 DIAGNOSIS — J449 Chronic obstructive pulmonary disease, unspecified: Secondary | ICD-10-CM | POA: Diagnosis not present

## 2023-08-06 DIAGNOSIS — Z87891 Personal history of nicotine dependence: Secondary | ICD-10-CM

## 2023-08-06 MED ORDER — BREZTRI AEROSPHERE 160-9-4.8 MCG/ACT IN AERO: 2.0000 | INHALATION_SPRAY | Freq: Two times a day (BID) | RESPIRATORY_TRACT | Status: DC

## 2023-08-06 NOTE — Progress Notes (Signed)
Synopsis: Referred in December 2024 for COPD  Subjective:   PATIENT ID: [name] GENDER: male DOB: [birth year], MRN: [id number]  HPI  Chief Complaint  Patient presents with   Shortness of Breath   Candy Arambul is a 72 year old male, former smoker with COPD who is referred to pulmonary clinic for evaluation of dyspnea.   The patient, with a history of smoking and a recent switch from [facility] to Trelegy for management of chronic respiratory symptoms, presents with ongoing shortness of breath. The dyspnea, which has been present for a couple of years, is associated with occasional wheezing but the cough has significantly improved. The patient reports a marked improvement in symptoms since the switch to Trelegy from [place], which was reportedly causing significant distress and limiting daily activities. Despite this, the patient has been experimenting with the dosing frequency of Trelegy due to throat irritation and an unpleasant taste, which he attributes to the medication.  The patient has a significant smoking history, having quit cigarettes a year and a half ago after a severe episode of respiratory distress. He continues to smoke marijuana occasionally. He also has a history of several severe respiratory events, which were managed with prednisone and albuterol, and has a prescription for prednisone at home for use in case of future exacerbations.  In addition to respiratory symptoms, the patient reports a recent onset of widespread skin dryness and itching, which has been particularly bothersome over the past three weeks. This has been affecting his sleep and causing significant discomfort. The patient has a history of melanoma and has been seeing a dermatologist regularly for monitoring.  The patient also mentions a decrease in physical activity due to pain in the left hip, which has been causing a limp. 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 He expresses a reluctance to engage in exercise due to the hip pain.    Past Medical History:  Diagnosis Date   Adenomatous polyps    Cancer (HCC) 2022   Melanoma-removed   COPD (chronic obstructive pulmonary disease) (HCC)    Erectile dysfunction    buys sildenafil from [place] or other- 40 or 60mg    Genital herpes    last outbreak 2012 or before   Hyperlipidemia    ON ROSUVASTATIN   Marijuana use    alomst on daily basis   Tobacco abuse    1.5 PPD. uses as a crutch. not ready to quit     Family History  Problem Relation Age of Onset   Other Mother        died in 33s- unknown cause   Heart attack Father        died at age 17   Stroke Father        33   Colon polyps Maternal Uncle    Colon cancer Neg Hx    Esophageal cancer Neg Hx    Stomach cancer Neg Hx    Rectal cancer Neg Hx      Social History   Socioeconomic History   Marital status: Divorced    Spouse name: Not on file   Number of children: Not on file   Years of education: Not on file   Highest education level: Not on file  Occupational History   Occupation: Advertising account planner  Tobacco Use   Smoking status: Former    Current packs/day: 0.00    Average packs/day: 1 pack/day  for 30.0 years (30.0 ttl pk-yrs)    Types: Cigarettes    Start date: 02/10/1992    Quit date: 02/09/2022    Years since quitting: 1.4   Smokeless tobacco: Never   Tobacco comments:    Had a cigarette at 1130 am today 09/02/21  Vaping Use   Vaping status: Never Used  Substance and Sexual Activity   Alcohol use: Yes    Alcohol/week: 4.0 standard drinks of alcohol    Types: 4 Standard drinks or equivalent per week    Comment: occassional   Drug use: Not Currently    Frequency: 7.0 times per week    Types: Marijuana    Comment: marijuana use daily   Sexual activity: Yes    Comment: GF already has genital herpes  Other Topics Concern   Not on file  Social History Narrative   Divorced. GF  10 years in 2018. Son from prior marriage. No grandkids.       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       HObbies: time with gf, playing guitar, time in [place]- [place] with GF. Enjoys hiking.    Social Drivers of Corporate investment banker Strain: Low Risk  (01/04/2023)   Overall Financial Resource Strain (CARDIA)    Difficulty of Paying Living Expenses: Not hard at all  Food Insecurity: No Food Insecurity (01/04/2023)   Hunger Vital Sign    Worried About Running Out of Food in the Last Year: Never true    Ran Out of Food in the Last Year: Never true  Transportation Needs: No Transportation Needs (01/04/2023)   PRAPARE - Administrator, Civil Service (Medical): No    Lack of Transportation (Non-Medical): No  Physical Activity: Inactive (01/04/2023)   Exercise Vital Sign    Days of Exercise per Week: 0 days    Minutes of Exercise per Session: 0 min  Stress: No Stress Concern Present (01/04/2023)   [employer] of Occupational Health - Occupational Stress Questionnaire    Feeling of Stress : Not at all  Social Connections: Socially Isolated (01/04/2023)   Social Connection and Isolation Panel [NHANES]    Frequency of Communication with Friends and Family: More than three times a week    Frequency of Social Gatherings with Friends and Family: More than three times a week    Attends Religious Services: Never    Database administrator or Organizations: No    Attends Banker Meetings: Never    Marital Status: Divorced  Catering manager Violence: Not At Risk (01/04/2023)   Humiliation, Afraid, Rape, and Kick questionnaire    Fear of Current or Ex-Partner: No    Emotionally Abused: No    Physically Abused: No    Sexually Abused: No     No Known Allergies   Outpatient Medications Prior to Visit  Medication Sig Dispense Refill   albuterol (VENTOLIN HFA) 108 (90 Base) MCG/ACT inhaler INHALE 2  PUFFS INTO THE LUNGS EVERY 4 HOURS AS NEEDED FOR WHEEZING OR SHORTNESS OF BREATH 6.7 g 3   clobetasol cream (TEMOVATE) 0.05 % Apply topically.     Fluticasone-Umeclidin-Vilant (TRELEGY ELLIPTA) 200-62.5-25 MCG/ACT AEPB Inhale 1 Inhalation into the lungs daily. 30 each 11   predniSONE (DELTASONE) 50 MG tablet Take 1 tablet (50 mg total) by mouth daily with breakfast. 7 tablet 0   sildenafil (VIAGRA) 50 MG tablet Take 20 mg  by mouth daily as needed for erectile dysfunction.     triamcinolone cream (KENALOG) 0.1 % Apply 1 application topically 2 (two) times daily. For 7-10 days maximum 30 g 0   rosuvastatin (CRESTOR) 10 MG tablet Take 1 tablet (10 mg total) by mouth once a week. (Patient not taking: Reported on 08/06/2023) 13 tablet 3   No facility-administered medications prior to visit.    Review of Systems  Constitutional:  Negative for chills, fever, malaise/fatigue and weight loss.  HENT:  Positive for sore throat. Negative for congestion and sinus pain.   Eyes: Negative.   Respiratory:  Positive for cough, shortness of breath and wheezing. Negative for hemoptysis and sputum production.   Cardiovascular:  Negative for chest pain, palpitations, orthopnea, claudication and leg swelling.  Gastrointestinal:  Negative for abdominal pain, heartburn, nausea and vomiting.  Genitourinary: Negative.   Musculoskeletal:  Negative for joint pain and myalgias.  Skin:  Positive for itching. Negative for rash.  Neurological:  Negative for weakness.  Endo/Heme/Allergies: Negative.   Psychiatric/Behavioral: Negative.        Objective:   Vitals:   08/06/23 1537  BP: 109/71  Pulse: 80  SpO2: 97%  Weight: 225 lb 9.6 oz (102.3 kg)  Height: 6\' 1"  (1.854 m)     Physical Exam Constitutional:      General: He is not in acute distress.    Appearance: Normal appearance.  Eyes:     General: No scleral icterus.    Conjunctiva/sclera: Conjunctivae normal.  Cardiovascular:     Rate and Rhythm:  Normal rate and regular rhythm.  Pulmonary:     Breath sounds: Examination of the right-upper field reveals wheezing. Wheezing present. No rhonchi or rales.  Musculoskeletal:     Right lower leg: No edema.     Left lower leg: No edema.  Skin:    General: Skin is warm and dry.  Neurological:     General: No focal deficit present.       CBC    Component Value Date/Time   WBC 6.3 12/13/2022 1129   RBC 5.10 12/13/2022 1129   HGB 15.7 12/13/2022 1129   HCT 47.1 12/13/2022 1129   PLT 196.0 12/13/2022 1129   MCV 92.3 12/13/2022 1129   MCH 30.5 10/29/2020 1350   MCHC 33.3 12/13/2022 1129   RDW 14.9 12/13/2022 1129   LYMPHSABS 1.2 12/13/2022 1129   MONOABS 0.7 12/13/2022 1129   EOSABS 0.4 12/13/2022 1129   BASOSABS 0.0 12/13/2022 1129      Latest Ref Rng & Units 12/13/2022   11:29 AM 03/03/2022   11:22 AM 10/25/2021    3:43 PM  BMP  Glucose 70 - 99 mg/dL 94  94  79   BUN 6 - 23 mg/dL 18  18  22    Creatinine 0.40 - 1.50 mg/dL 6.29  5.28  4.13   Sodium 135 - 145 mEq/L 138  138  138   Potassium 3.5 - 5.1 mEq/L 5.0  4.5  5.7 No hemolysis seen   Chloride 96 - 112 mEq/L 103  100  101   CO2 19 - 32 mEq/L 26  32  32   Calcium 8.4 - 10.5 mg/dL 8.9  8.7  9.4    Chest imaging: CXR 02/22/22 Chronic but increased bronchial thickening consistent with COPD exacerbation. No localizing pulmonary process.    PFT:     No data to display          Labs:  Path:  Echo:  Heart Catheterization:     Assessment & Plan:   Chronic obstructive pulmonary disease, unspecified COPD type (HCC) - Plan: Pulmonary Function Test  Former cigarette smoker  Discussion: Danial Rueff is a 72 year old male, former smoker with COPD who is referred to pulmonary clinic for evaluation of dyspnea.   Chronic Obstructive Pulmonary Disease (COPD) / Asthma History of smoking with symptoms of shortness of breath, wheezing, and cough. Significant improvement with Trelegy, but some throat irritation  reported. Mild wheezing noted on examination. -Continue Trelegy one puff daily. -Trial of Breztri inhaler as an alternative to Trelegy. -Order pulmonary function tests for formal diagnosis. -Will schedule him for LCS CT chest scan  Lung Cancer Screening History of heavy smoking. -Schedule for CT scan for lung cancer screening.  Follow-up in 3 months for reassessment and review of test results.  Melody Comas, MD [facility] Office: [phone]    Current Outpatient Medications:    albuterol (VENTOLIN HFA) 108 (90 Base) MCG/ACT inhaler, INHALE 2 PUFFS INTO THE LUNGS EVERY 4 HOURS AS NEEDED FOR WHEEZING OR SHORTNESS OF BREATH, Disp: 6.7 g, Rfl: 3   Budeson-Glycopyrrol-Formoterol (BREZTRI AEROSPHERE) 160-9-4.8 MCG/ACT AERO, Inhale 2 puffs into the lungs in the morning and at bedtime., Disp: , Rfl:    clobetasol cream (TEMOVATE) 0.05 %, Apply topically., Disp: , Rfl:    Fluticasone-Umeclidin-Vilant (TRELEGY ELLIPTA) 200-62.5-25 MCG/ACT AEPB, Inhale 1 Inhalation into the lungs daily., Disp: 30 each, Rfl: 11   predniSONE (DELTASONE) 50 MG tablet, Take 1 tablet (50 mg total) by mouth daily with breakfast., Disp: 7 tablet, Rfl: 0   sildenafil (VIAGRA) 50 MG tablet, Take 20 mg by mouth daily as needed for erectile dysfunction., Disp: , Rfl:    triamcinolone cream (KENALOG) 0.1 %, Apply 1 application topically 2 (two) times daily. For 7-10 days maximum, Disp: 30 g, Rfl: 0   rosuvastatin (CRESTOR) 10 MG tablet, Take 1 tablet (10 mg total) by mouth once a week. (Patient not taking: Reported on 08/06/2023), Disp: 13 tablet, Rfl: 3

## 2023-08-06 NOTE — Patient Instructions (Addendum)
Try Breztri inhaler 2 puffs twice daily until sample runs out - rinse mouth out after each use - stop trelegy while using breztri  Let us know which inhaler you prefer to continue on via myChart message  Continue trelegy ellipta 1 puff daily - rinse mouth out after each use  Use albterol inhaler 1-2 puffs as needed  We will schedule you for the lung cancer CT chest scan  We will schedule you for pulmonary function tests as soon as able  Follow up in 3 months, call sooner if needed

## 2023-08-28 ENCOUNTER — Ambulatory Visit (HOSPITAL_COMMUNITY)
Admission: RE | Admit: 2023-08-28 | Discharge: 2023-08-28 | Disposition: A | Discharge status: Home / Self Care | Payer: PPO | Source: Ambulatory Visit | Attending: Acute Care | Admitting: Acute Care

## 2023-08-28 DIAGNOSIS — Z87891 Personal history of nicotine dependence: Secondary | ICD-10-CM | POA: Diagnosis not present

## 2023-08-28 DIAGNOSIS — Z122 Encounter for screening for malignant neoplasm of respiratory organs: Secondary | ICD-10-CM | POA: Insufficient documentation

## 2023-08-30 DIAGNOSIS — Z85820 Personal history of malignant melanoma of skin: Secondary | ICD-10-CM | POA: Diagnosis not present

## 2023-08-30 DIAGNOSIS — L304 Erythema intertrigo: Secondary | ICD-10-CM | POA: Diagnosis not present

## 2023-08-30 DIAGNOSIS — L308 Other specified dermatitis: Secondary | ICD-10-CM | POA: Diagnosis not present

## 2023-08-30 DIAGNOSIS — Z08 Encounter for follow-up examination after completed treatment for malignant neoplasm: Secondary | ICD-10-CM | POA: Diagnosis not present

## 2023-09-07 ENCOUNTER — Telehealth: Payer: Self-pay | Admitting: Acute Care

## 2023-09-07 NOTE — Telephone Encounter (Signed)
[facility] calling with call report. For CT scan. [facility] phone number is [phone].

## 2023-09-07 NOTE — Telephone Encounter (Signed)
Call report received  IMPRESSION: 1. Lung-RADS 4A, suspicious. Follow up low-dose chest CT without contrast in 3 months (please use the following order, "CT CHEST LCS NODULE FOLLOW-UP W/O CM") is recommended. Alternatively, PET may be considered when there is a solid component 8mm or larger. Left upper lobe somewhat linear, irregular 9.3 mm density could represent scarring or a developing nodule. 2. Aortic atherosclerosis (ICD10-I70.0) and emphysema (ICD10-J43.9). 3. Aortic valvular calcifications. Consider echocardiography to evaluate for valvular dysfunction.

## 2023-09-14 ENCOUNTER — Telehealth: Payer: Self-pay | Admitting: Acute Care

## 2023-09-14 NOTE — Telephone Encounter (Signed)
I have called the patient with the results of his low-dose lung cancer screening scan.  His scan was read as a lung RADS 4A, as there is a left upper lobe linear irregular 9.3 mm density that could be scarring or a developing nodule. I explained that plan will be for a 39-month follow-up to reevaluate the nodule and determine next best steps in plan of care Patient is in agreement with this plan. He understands he will get a call closer to the time to have the 64-month follow-up low-dose CT, and then follow-up in the office with me after the scan has been read This will be late March or early April. He is aware of 2 prior appointments with Dr. Francine Graven in February 2025 to have pulmonary function test done and to review them.  Jonita Albee, and [place] please fax results to PCP Please let PCP know that there was also notation of aortic atherosclerosis, aortic valvular calcifications, and recommendation for consideration of an echocardiogram to evaluate for valvular dysfunction.  Let him know the plan is for a 3 loaded CT to reevaluate the nodule of concern and determine best plan of care moving forward  Thank you so much

## 2023-09-15 NOTE — Telephone Encounter (Signed)
Noted, thanks.  JD

## 2023-09-17 ENCOUNTER — Other Ambulatory Visit: Payer: Self-pay

## 2023-09-17 DIAGNOSIS — Z122 Encounter for screening for malignant neoplasm of respiratory organs: Secondary | ICD-10-CM

## 2023-09-17 DIAGNOSIS — R911 Solitary pulmonary nodule: Secondary | ICD-10-CM

## 2023-09-17 DIAGNOSIS — Z87891 Personal history of nicotine dependence: Secondary | ICD-10-CM

## 2023-09-17 NOTE — Telephone Encounter (Signed)
3 month CT order placed, results and plan to PCP.

## 2023-10-08 ENCOUNTER — Encounter: Payer: Self-pay | Admitting: Pulmonary Disease

## 2023-10-09 ENCOUNTER — Ambulatory Visit: Payer: PPO | Admitting: Pulmonary Disease

## 2023-10-10 ENCOUNTER — Ambulatory Visit (INDEPENDENT_AMBULATORY_CARE_PROVIDER_SITE_OTHER): Payer: PPO | Admitting: Pulmonary Disease

## 2023-10-10 ENCOUNTER — Other Ambulatory Visit: Payer: Self-pay | Admitting: *Deleted

## 2023-10-10 DIAGNOSIS — J449 Chronic obstructive pulmonary disease, unspecified: Secondary | ICD-10-CM

## 2023-10-10 DIAGNOSIS — R911 Solitary pulmonary nodule: Secondary | ICD-10-CM

## 2023-10-10 LAB — PULMONARY FUNCTION TEST
DL/VA % pred: 65 %
DL/VA: 2.59 ml/min/mmHg/L
DLCO cor % pred: 60 %
DLCO cor: 16.74 ml/min/mmHg
DLCO unc % pred: 60 %
DLCO unc: 16.74 ml/min/mmHg
FEF 25-75 Post: 1.11 L/s
FEF 25-75 Pre: 0.76 L/s
FEF2575-%Change-Post: 45 %
FEF2575-%Pred-Post: 42 %
FEF2575-%Pred-Pre: 29 %
FEV1-%Change-Post: 18 %
FEV1-%Pred-Post: 53 %
FEV1-%Pred-Pre: 44 %
FEV1-Post: 1.88 L
FEV1-Pre: 1.59 L
FEV1FVC-%Change-Post: -2 %
FEV1FVC-%Pred-Pre: 70 %
FEV6-%Change-Post: 18 %
FEV6-%Pred-Post: 80 %
FEV6-%Pred-Pre: 67 %
FEV6-Post: 3.65 L
FEV6-Pre: 3.08 L
FEV6FVC-%Change-Post: -2 %
FEV6FVC-%Pred-Post: 103 %
FEV6FVC-%Pred-Pre: 105 %
FVC-%Change-Post: 21 %
FVC-%Pred-Post: 77 %
FVC-%Pred-Pre: 63 %
FVC-Post: 3.75 L
FVC-Pre: 3.08 L
Post FEV1/FVC ratio: 50 %
Post FEV6/FVC ratio: 97 %
Pre FEV1/FVC ratio: 52 %
Pre FEV6/FVC Ratio: 100 %
RV % pred: 273 %
RV: 7.32 L
TLC % pred: 144 %
TLC: 11.02 L

## 2023-10-10 NOTE — Progress Notes (Signed)
 Full PFT performed today.

## 2023-10-10 NOTE — Patient Instructions (Signed)
 Full PFT performed today.

## 2023-10-26 ENCOUNTER — Ambulatory Visit: Payer: PPO | Admitting: Pulmonary Disease

## 2023-10-26 ENCOUNTER — Encounter: Payer: Self-pay | Admitting: Pulmonary Disease

## 2023-10-26 VITALS — BP 111/72 | HR 85 | Ht 74.0 in | Wt 223.0 lb

## 2023-10-26 DIAGNOSIS — Z87891 Personal history of nicotine dependence: Secondary | ICD-10-CM

## 2023-10-26 DIAGNOSIS — J449 Chronic obstructive pulmonary disease, unspecified: Secondary | ICD-10-CM | POA: Diagnosis not present

## 2023-10-26 NOTE — Patient Instructions (Addendum)
 You breathing tests show moderately severe obstructive lung disease  Continue trelegy ellipta 200, 1 puff daily - rinse mouth out after each use  Use albuterol inhaler 1-2 puffs as needed  We will schedule you for overnight oxygen test  Follow up in 6 months, call sooner if needed

## 2023-10-26 NOTE — Progress Notes (Signed)
 Synopsis: Referred in December 2024 for COPD  Subjective:   PATIENT ID: Keith Curtis GENDER: male DOB: 1951/02/24, MRN: [id number]  HPI  Chief Complaint  Patient presents with   Follow-up   Raimundo Corbit is a 73 year old male, former smoker with COPD who returns to pulmonary clinic for COPD.   OV 08/06/23 The patient, with a history of smoking and a recent switch from [facility] to Trelegy for management of chronic respiratory symptoms, presents with ongoing shortness of breath. The dyspnea, which has been present for a couple of years, is associated with occasional wheezing but the cough has significantly improved. The patient reports a marked improvement in symptoms since the switch to Trelegy from [place], which was reportedly causing significant distress and limiting daily activities. Despite this, the patient has been experimenting with the dosing frequency of Trelegy due to throat irritation and an unpleasant taste, which he attributes to the medication.  The patient has a significant smoking history, having quit cigarettes a year and a half ago after a severe episode of respiratory distress. He continues to smoke marijuana occasionally. He also has a history of several severe respiratory events, which were managed with prednisone and albuterol, and has a prescription for prednisone at home for use in case of future exacerbations.  In addition to respiratory symptoms, the patient reports a recent onset of widespread skin dryness and itching, which has been particularly bothersome over the past three weeks. This has been affecting his sleep and causing significant discomfort. The patient has a history of melanoma and has been seeing a dermatologist regularly for monitoring.  The patient also mentions a decrease in physical activity due to pain in the left hip, which has been causing a limp. 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 He expresses a reluctance to engage in exercise due to the hip pain.  OV 10/26/23 He tried breztri after last visit and prefers to stay on trelegy. He has not required albuterol much since being on trelegy.  PFTs show moderately severe obstruction, air trapping and mild diffusion defect with significant bronchodilator response.  He had LCS screening CT in January with plan for 3 month follow up scan.   Past Medical History:  Diagnosis Date   Adenomatous polyps    Cancer (HCC) 2022   Melanoma-removed   COPD (chronic obstructive pulmonary disease) (HCC)    Erectile dysfunction    buys sildenafil from [place] or other- 40 or 60mg    Genital herpes    last outbreak 2012 or before   Hyperlipidemia    ON ROSUVASTATIN   Marijuana use    alomst on daily basis   Tobacco abuse    1.5 PPD. uses as a crutch. not ready to quit     Family History  Problem Relation Age of Onset   Other Mother        died in 34s- unknown cause   Heart attack Father        died at age 69   Stroke Father        78   Colon polyps Maternal Uncle    Colon cancer Neg Hx    Esophageal cancer Neg Hx    Stomach cancer Neg Hx    Rectal cancer Neg Hx      Social History   Socioeconomic History   Marital status: Divorced    Spouse name: Not on file  Number of children: Not on file   Years of education: Not on file   Highest education level: Not on file  Occupational History   Occupation: Advertising account planner  Tobacco Use   Smoking status: Former    Current packs/day: 0.00    Average packs/day: 1 pack/day for 30.0 years (30.0 ttl pk-yrs)    Types: Cigarettes    Start date: 02/10/1992    Quit date: 02/09/2022    Years since quitting: 1.7   Smokeless tobacco: Never   Tobacco comments:    Had a cigarette at 1130 am today 09/02/21  Vaping Use   Vaping status: Never Used  Substance and Sexual Activity   Alcohol use: Yes    Alcohol/week: 4.0 standard drinks of alcohol    Types: 4  Standard drinks or equivalent per week    Comment: occassional   Drug use: Not Currently    Frequency: 7.0 times per week    Types: Marijuana    Comment: marijuana use daily   Sexual activity: Yes    Comment: GF already has genital herpes  Other Topics Concern   Not on file  Social History Narrative   Divorced. GF 10 years in 2018. Son from prior marriage. No grandkids.       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       HObbies: time with gf, playing guitar, time in hanging [place]- has house there with GF. Enjoys hiking.    Social Drivers of Corporate investment banker Strain: Low Risk  (01/04/2023)   Overall Financial Resource Strain (CARDIA)    Difficulty of Paying Living Expenses: Not hard at all  Food Insecurity: No Food Insecurity (01/04/2023)   Hunger Vital Sign    Worried About Running Out of Food in the Last Year: Never true    Ran Out of Food in the Last Year: Never true  Transportation Needs: No Transportation Needs (01/04/2023)   PRAPARE - Administrator, Civil Service (Medical): No    Lack of Transportation (Non-Medical): No  Physical Activity: Inactive (01/04/2023)   Exercise Vital Sign    Days of Exercise per Week: 0 days    Minutes of Exercise per Session: 0 min  Stress: No Stress Concern Present (01/04/2023)   [employer] of Occupational Health - Occupational Stress Questionnaire    Feeling of Stress : Not at all  Social Connections: Socially Isolated (01/04/2023)   Social Connection and Isolation Panel [NHANES]    Frequency of Communication with Friends and Family: More than three times a week    Frequency of Social Gatherings with Friends and Family: More than three times a week    Attends Religious Services: Never    Database administrator or Organizations: No    Attends Banker Meetings: Never    Marital Status: Divorced  Catering manager Violence: Not At Risk (01/04/2023)    Humiliation, Afraid, Rape, and Kick questionnaire    Fear of Current or Ex-Partner: No    Emotionally Abused: No    Physically Abused: No    Sexually Abused: No     No Known Allergies   Outpatient Medications Prior to Visit  Medication Sig Dispense Refill   albuterol (VENTOLIN HFA) 108 (90 Base) MCG/ACT inhaler INHALE 2 PUFFS INTO THE LUNGS EVERY 4 HOURS AS NEEDED FOR WHEEZING OR SHORTNESS OF BREATH 6.7 g 3   clobetasol cream (TEMOVATE) 0.05 % Apply topically.  Fluticasone-Umeclidin-Vilant (TRELEGY ELLIPTA) 200-62.5-25 MCG/ACT AEPB Inhale 1 Inhalation into the lungs daily. 30 each 11   sildenafil (VIAGRA) 50 MG tablet Take 20 mg by mouth daily as needed for erectile dysfunction.     triamcinolone cream (KENALOG) 0.1 % Apply 1 application topically 2 (two) times daily. For 7-10 days maximum 30 g 0   Budeson-Glycopyrrol-Formoterol (BREZTRI AEROSPHERE) 160-9-4.8 MCG/ACT AERO Inhale 2 puffs into the lungs in the morning and at bedtime.     predniSONE (DELTASONE) 50 MG tablet Take 1 tablet (50 mg total) by mouth daily with breakfast. 7 tablet 0   rosuvastatin (CRESTOR) 10 MG tablet Take 1 tablet (10 mg total) by mouth once a week. 13 tablet 3   No facility-administered medications prior to visit.    Review of Systems  Constitutional:  Negative for chills, fever, malaise/fatigue and weight loss.  HENT:  Negative for congestion, sinus pain and sore throat.   Eyes: Negative.   Respiratory:  Positive for shortness of breath. Negative for cough, hemoptysis, sputum production and wheezing.   Cardiovascular:  Negative for chest pain, palpitations, orthopnea, claudication and leg swelling.  Gastrointestinal:  Negative for abdominal pain, heartburn, nausea and vomiting.  Genitourinary: Negative.   Musculoskeletal:  Negative for joint pain and myalgias.  Skin:  Negative for itching and rash.  Neurological:  Negative for weakness.  Endo/Heme/Allergies: Negative.   Psychiatric/Behavioral:  Negative.      Objective:   Vitals:   10/26/23 1138 10/26/23 1141  BP: 111/72 111/72  Pulse: 85 85  SpO2: 92% 92%  Weight: 223 lb (101.2 kg)   Height: 6\' 2"  (1.88 m)     Physical Exam Constitutional:      General: He is not in acute distress.    Appearance: Normal appearance.  Eyes:     General: No scleral icterus.    Conjunctiva/sclera: Conjunctivae normal.  Cardiovascular:     Rate and Rhythm: Normal rate and regular rhythm.  Pulmonary:     Breath sounds: No wheezing, rhonchi or rales.  Musculoskeletal:     Right lower leg: No edema.     Left lower leg: No edema.  Skin:    General: Skin is warm and dry.  Neurological:     General: No focal deficit present.     CBC    Component Value Date/Time   WBC 6.3 12/13/2022 1129   RBC 5.10 12/13/2022 1129   HGB 15.7 12/13/2022 1129   HCT 47.1 12/13/2022 1129   PLT 196.0 12/13/2022 1129   MCV 92.3 12/13/2022 1129   MCH 30.5 10/29/2020 1350   MCHC 33.3 12/13/2022 1129   RDW 14.9 12/13/2022 1129   LYMPHSABS 1.2 12/13/2022 1129   MONOABS 0.7 12/13/2022 1129   EOSABS 0.4 12/13/2022 1129   BASOSABS 0.0 12/13/2022 1129      Latest Ref Rng & Units 12/13/2022   11:29 AM 03/03/2022   11:22 AM 10/25/2021    3:43 PM  BMP  Glucose 70 - 99 mg/dL 94  94  79   BUN 6 - 23 mg/dL 18  18  22    Creatinine 0.40 - 1.50 mg/dL 1.61  0.96  0.45   Sodium 135 - 145 mEq/L 138  138  138   Potassium 3.5 - 5.1 mEq/L 5.0  4.5  5.7 No hemolysis seen   Chloride 96 - 112 mEq/L 103  100  101   CO2 19 - 32 mEq/L 26  32  32   Calcium 8.4 - 10.5 mg/dL 8.9  8.7  9.4    Chest imaging: CXR 02/22/22 Chronic but increased bronchial thickening consistent with COPD exacerbation. No localizing pulmonary process.    PFT:    Latest Ref Rng & Units 10/10/2023   11:11 AM  PFT Results  FVC-Pre L 3.08   FVC-Predicted Pre % 63   FVC-Post L 3.75   FVC-Predicted Post % 77   Pre FEV1/FVC % % 52   Post FEV1/FCV % % 50   FEV1-Pre L 1.59   FEV1-Predicted  Pre % 44   FEV1-Post L 1.88   DLCO uncorrected ml/min/mmHg 16.74   DLCO UNC% % 60   DLCO corrected ml/min/mmHg 16.74   DLCO COR %Predicted % 60   DLVA Predicted % 65   TLC L 11.02   TLC % Predicted % 144   RV % Predicted % 273   PFT: Moderately severe obstruction with air trapping, significant bronchodilator response and moderate diffusion defect  Labs:  Path:  Echo:  Heart Catheterization:     Assessment & Plan:   Chronic obstructive pulmonary disease, unspecified COPD type (HCC) - Plan: Pulse oximetry, overnight  Discussion: Keith Curtis is a 73 year old male, former smoker with COPD who is referred to pulmonary clinic for evaluation of dyspnea.   Chronic Obstructive Pulmonary Disease (COPD) / Asthma History of smoking with symptoms of shortness of breath, wheezing, and cough. Significant improvement with Trelegy. -Continue Trelegy one puff daily. -check overnight oxygen study  Lung Cancer Screening History of heavy smoking. -had scan done in 08/2022 with 3 month follow up recommended  Follow-up in 6 months  Melody Comas, MD [facility] Office: [phone]    Current Outpatient Medications:    albuterol (VENTOLIN HFA) 108 (90 Base) MCG/ACT inhaler, INHALE 2 PUFFS INTO THE LUNGS EVERY 4 HOURS AS NEEDED FOR WHEEZING OR SHORTNESS OF BREATH, Disp: 6.7 g, Rfl: 3   clobetasol cream (TEMOVATE) 0.05 %, Apply topically., Disp: , Rfl:    Fluticasone-Umeclidin-Vilant (TRELEGY ELLIPTA) 200-62.5-25 MCG/ACT AEPB, Inhale 1 Inhalation into the lungs daily., Disp: 30 each, Rfl: 11   sildenafil (VIAGRA) 50 MG tablet, Take 20 mg by mouth daily as needed for erectile dysfunction., Disp: , Rfl:    triamcinolone cream (KENALOG) 0.1 %, Apply 1 application topically 2 (two) times daily. For 7-10 days maximum, Disp: 30 g, Rfl: 0

## 2023-10-30 ENCOUNTER — Ambulatory Visit: Payer: PPO | Admitting: Family Medicine

## 2023-11-21 DIAGNOSIS — G4730 Sleep apnea, unspecified: Secondary | ICD-10-CM | POA: Diagnosis not present

## 2023-11-21 DIAGNOSIS — R0902 Hypoxemia: Secondary | ICD-10-CM | POA: Diagnosis not present

## 2023-11-23 ENCOUNTER — Telehealth: Payer: Self-pay | Admitting: Pulmonary Disease

## 2023-11-23 NOTE — Telephone Encounter (Signed)
 ONO Results:  Please let patient know his ONO showed he spent 5hr with oxygen saturation less than 88%. He qualifies for home oxygen use at night when sleeping. He had 48 events where his oxygen levels dropped greater than 4% at a time. This could be indicative of sleep apnea.   Options: - check a home sleep study to evaluate for sleep apnea and then start CPAP with possible oxygen use  Or  - start 2L supplemental oxygen with sleep.  Please place order for home study or nocturnal oxygen supplies based on his wishes.  Thanks, JD

## 2023-11-23 NOTE — Telephone Encounter (Signed)
 Spoke with PT about results pt states he does not understand any of it and I told him I will ask Dr. Francine Graven if he would like to to come in sooner then appt. To explain more in detail

## 2023-11-26 NOTE — Telephone Encounter (Signed)
 Spoke with patient regarding prior message.Patient stated he would like to come in to talk to Dr.Dewald about his ONO test and stated that he doesn't know how correct the test is.   Dr.Dewald can we use one of your provider requested slot's

## 2023-11-26 NOTE — Telephone Encounter (Signed)
 Yes, ok to use a blocked slot. Can do virtual visit if easier for him as well.  JD

## 2023-11-26 NOTE — Telephone Encounter (Signed)
 Called and scheduled pt for 4/29 in person visit.

## 2023-11-27 ENCOUNTER — Ambulatory Visit (HOSPITAL_COMMUNITY)
Admission: RE | Admit: 2023-11-27 | Discharge: 2023-11-27 | Disposition: A | Discharge status: Home / Self Care | Source: Ambulatory Visit | Attending: Acute Care | Admitting: Acute Care

## 2023-11-27 DIAGNOSIS — I7121 Aneurysm of the ascending aorta, without rupture: Secondary | ICD-10-CM | POA: Diagnosis not present

## 2023-11-27 DIAGNOSIS — R911 Solitary pulmonary nodule: Secondary | ICD-10-CM | POA: Diagnosis not present

## 2023-11-27 DIAGNOSIS — J432 Centrilobular emphysema: Secondary | ICD-10-CM | POA: Diagnosis not present

## 2023-11-27 DIAGNOSIS — Z87891 Personal history of nicotine dependence: Secondary | ICD-10-CM | POA: Diagnosis not present

## 2023-11-27 DIAGNOSIS — I700 Atherosclerosis of aorta: Secondary | ICD-10-CM | POA: Diagnosis not present

## 2023-11-27 DIAGNOSIS — Z122 Encounter for screening for malignant neoplasm of respiratory organs: Secondary | ICD-10-CM | POA: Diagnosis not present

## 2023-11-29 ENCOUNTER — Encounter: Payer: Self-pay | Admitting: Pulmonary Disease

## 2023-12-11 ENCOUNTER — Ambulatory Visit: Admitting: Pulmonary Disease

## 2023-12-11 ENCOUNTER — Encounter: Payer: Self-pay | Admitting: Pulmonary Disease

## 2023-12-11 VITALS — BP 108/74 | Ht 74.0 in | Wt 223.0 lb

## 2023-12-11 DIAGNOSIS — G4734 Idiopathic sleep related nonobstructive alveolar hypoventilation: Secondary | ICD-10-CM | POA: Diagnosis not present

## 2023-12-11 DIAGNOSIS — R911 Solitary pulmonary nodule: Secondary | ICD-10-CM | POA: Diagnosis not present

## 2023-12-11 DIAGNOSIS — J449 Chronic obstructive pulmonary disease, unspecified: Secondary | ICD-10-CM

## 2023-12-11 DIAGNOSIS — Z87891 Personal history of nicotine dependence: Secondary | ICD-10-CM

## 2023-12-11 NOTE — Progress Notes (Unsigned)
 Synopsis: Referred in December 2024 for COPD  Subjective:   PATIENT ID: Keith Curtis GENDER: male DOB: 02-Mar-1951, MRN: [id number]  HPI  Chief Complaint  Patient presents with   Follow-up   Keith Curtis is a 73 year old male, former smoker with COPD who returns to pulmonary clinic for COPD.   OV 08/06/23 The patient, with a history of smoking and a recent switch from [facility] to Trelegy for management of chronic respiratory symptoms, presents with ongoing shortness of breath. The dyspnea, which has been present for a couple of years, is associated with occasional wheezing but the cough has significantly improved. The patient reports a marked improvement in symptoms since the switch to Trelegy from [place], which was reportedly causing significant distress and limiting daily activities. Despite this, the patient has been experimenting with the dosing frequency of Trelegy due to throat irritation and an unpleasant taste, which he attributes to the medication.  The patient has a significant smoking history, having quit cigarettes a year and a half ago after a severe episode of respiratory distress. He continues to smoke marijuana occasionally. He also has a history of several severe respiratory events, which were managed with prednisone  and albuterol , and has a prescription for prednisone  at home for use in case of future exacerbations.  In addition to respiratory symptoms, the patient reports a recent onset of widespread skin dryness and itching, which has been particularly bothersome over the past three weeks. This has been affecting his sleep and causing significant discomfort. The patient has a history of melanoma and has been seeing a dermatologist regularly for monitoring.  The patient also mentions a decrease in physical activity due to pain in the left hip, which has been causing a limp. 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 He expresses a reluctance to engage in exercise due to the hip pain.  OV 10/26/23 He tried breztri  after last visit and prefers to stay on trelegy. He has not required albuterol  much since being on trelegy.  PFTs show moderately severe obstruction, air trapping and mild diffusion defect with significant bronchodilator response.  He had LCS screening CT in January with plan for 3 month follow up scan.   Past Medical History:  Diagnosis Date   Adenomatous polyps    Cancer (HCC) 2022   Melanoma-removed   COPD (chronic obstructive pulmonary disease) (HCC)    Erectile dysfunction    buys sildenafil from [place] or other- 40 or 60mg    Genital herpes    last outbreak 2012 or before   Hyperlipidemia    ON ROSUVASTATIN    Marijuana use    alomst on daily basis   Tobacco abuse    1.5 PPD. uses as a crutch. not ready to quit     Family History  Problem Relation Age of Onset   Other Mother        died in 68s- unknown cause   Heart attack Father        died at age 30   Stroke Father        38   Colon polyps Maternal Uncle    Colon cancer Neg Hx    Esophageal cancer Neg Hx    Stomach cancer Neg Hx    Rectal cancer Neg Hx      Social History   Socioeconomic History   Marital status: Divorced    Spouse name: Not on file  Number of children: Not on file   Years of education: Not on file   Highest education level: Not on file  Occupational History   Occupation: Advertising account planner  Tobacco Use   Smoking status: Former    Current packs/day: 0.00    Average packs/day: 1 pack/day for 30.0 years (30.0 ttl pk-yrs)    Types: Cigarettes    Start date: 02/10/1992    Quit date: 02/09/2022    Years since quitting: 1.8   Smokeless tobacco: Never   Tobacco comments:    Had a cigarette at 1130 am today 09/02/21  Vaping Use   Vaping status: Never Used  Substance and Sexual Activity   Alcohol use: Yes    Alcohol/week: 4.0 standard drinks of alcohol    Types: 4  Standard drinks or equivalent per week    Comment: occassional   Drug use: Not Currently    Frequency: 7.0 times per week    Types: Marijuana    Comment: marijuana use daily   Sexual activity: Yes    Comment: GF already has genital herpes  Other Topics Concern   Not on file  Social History Narrative   Divorced. GF 10 years in 2018. Son from prior marriage. No grandkids.       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       HObbies: time with gf, playing guitar, time in [place]- has house there with GF. Enjoys hiking.    Social Drivers of Corporate investment banker Strain: Low Risk  (01/04/2023)   Overall Financial Resource Strain (CARDIA)    Difficulty of Paying Living Expenses: Not hard at all  Food Insecurity: No Food Insecurity (01/04/2023)   Hunger Vital Sign    Worried About Running Out of Food in the Last Year: Never true    Ran Out of Food in the Last Year: Never true  Transportation Needs: No Transportation Needs (01/04/2023)   PRAPARE - Administrator, Civil Service (Medical): No    Lack of Transportation (Non-Medical): No  Physical Activity: Inactive (01/04/2023)   Exercise Vital Sign    Days of Exercise per Week: 0 days    Minutes of Exercise per Session: 0 min  Stress: No Stress Concern Present (01/04/2023)   [employer] of Occupational Health - Occupational Stress Questionnaire    Feeling of Stress : Not at all  Social Connections: Socially Isolated (01/04/2023)   Social Connection and Isolation Panel [NHANES]    Frequency of Communication with Friends and Family: More than three times a week    Frequency of Social Gatherings with Friends and Family: More than three times a week    Attends Religious Services: Never    Database administrator or Organizations: No    Attends Banker Meetings: Never    Marital Status: Divorced  Catering manager Violence: Not At Risk (01/04/2023)    Humiliation, Afraid, Rape, and Kick questionnaire    Fear of Current or Ex-Partner: No    Emotionally Abused: No    Physically Abused: No    Sexually Abused: No     No Known Allergies   Outpatient Medications Prior to Visit  Medication Sig Dispense Refill   albuterol  (VENTOLIN  HFA) 108 (90 Base) MCG/ACT inhaler INHALE 2 PUFFS INTO THE LUNGS EVERY 4 HOURS AS NEEDED FOR WHEEZING OR SHORTNESS OF BREATH 6.7 g 3   clobetasol cream (TEMOVATE) 0.05 % Apply topically.  Fluticasone -Umeclidin-Vilant (TRELEGY ELLIPTA ) 200-62.5-25 MCG/ACT AEPB Inhale 1 Inhalation into the lungs daily. 30 each 11   sildenafil (VIAGRA) 50 MG tablet Take 20 mg by mouth daily as needed for erectile dysfunction.     triamcinolone  cream (KENALOG ) 0.1 % Apply 1 application topically 2 (two) times daily. For 7-10 days maximum 30 g 0   No facility-administered medications prior to visit.    Review of Systems  Constitutional:  Negative for chills, fever, malaise/fatigue and weight loss.  HENT:  Negative for congestion, sinus pain and sore throat.   Eyes: Negative.   Respiratory:  Positive for shortness of breath. Negative for cough, hemoptysis, sputum production and wheezing.   Cardiovascular:  Negative for chest pain, palpitations, orthopnea, claudication and leg swelling.  Gastrointestinal:  Negative for abdominal pain, heartburn, nausea and vomiting.  Genitourinary: Negative.   Musculoskeletal:  Negative for joint pain and myalgias.  Skin:  Negative for itching and rash.  Neurological:  Negative for weakness.  Endo/Heme/Allergies: Negative.   Psychiatric/Behavioral: Negative.      Objective:   Vitals:   12/11/23 1556  BP: 108/74  Weight: 223 lb (101.2 kg)  Height: 6\' 2"  (1.88 m)     Physical Exam Constitutional:      General: He is not in acute distress.    Appearance: Normal appearance.  Eyes:     General: No scleral icterus.    Conjunctiva/sclera: Conjunctivae normal.  Cardiovascular:     Rate  and Rhythm: Normal rate and regular rhythm.  Pulmonary:     Breath sounds: No wheezing, rhonchi or rales.  Musculoskeletal:     Right lower leg: No edema.     Left lower leg: No edema.  Skin:    General: Skin is warm and dry.  Neurological:     General: No focal deficit present.     CBC    Component Value Date/Time   WBC 6.3 12/13/2022 1129   RBC 5.10 12/13/2022 1129   HGB 15.7 12/13/2022 1129   HCT 47.1 12/13/2022 1129   PLT 196.0 12/13/2022 1129   MCV 92.3 12/13/2022 1129   MCH 30.5 10/29/2020 1350   MCHC 33.3 12/13/2022 1129   RDW 14.9 12/13/2022 1129   LYMPHSABS 1.2 12/13/2022 1129   MONOABS 0.7 12/13/2022 1129   EOSABS 0.4 12/13/2022 1129   BASOSABS 0.0 12/13/2022 1129      Latest Ref Rng & Units 12/13/2022   11:29 AM 03/03/2022   11:22 AM 10/25/2021    3:43 PM  BMP  Glucose 70 - 99 mg/dL 94  94  79   BUN 6 - 23 mg/dL 18  18  22    Creatinine 0.40 - 1.50 mg/dL 4.74  2.59  5.63   Sodium 135 - 145 mEq/L 138  138  138   Potassium 3.5 - 5.1 mEq/L 5.0  4.5  5.7 No hemolysis seen   Chloride 96 - 112 mEq/L 103  100  101   CO2 19 - 32 mEq/L 26  32  32   Calcium  8.4 - 10.5 mg/dL 8.9  8.7  9.4    Chest imaging: CXR 02/22/22 Chronic but increased bronchial thickening consistent with COPD exacerbation. No localizing pulmonary process.    PFT:    Latest Ref Rng & Units 10/10/2023   11:11 AM  PFT Results  FVC-Pre L 3.08   FVC-Predicted Pre % 63   FVC-Post L 3.75   FVC-Predicted Post % 77   Pre FEV1/FVC % % 52   Post FEV1/FCV % %  50   FEV1-Pre L 1.59   FEV1-Predicted Pre % 44   FEV1-Post L 1.88   DLCO uncorrected ml/min/mmHg 16.74   DLCO UNC% % 60   DLCO corrected ml/min/mmHg 16.74   DLCO COR %Predicted % 60   DLVA Predicted % 65   TLC L 11.02   TLC % Predicted % 144   RV % Predicted % 273   PFT: Moderately severe obstruction with air trapping, significant bronchodilator response and moderate diffusion defect  Labs:  Path:  Echo:  Heart  Catheterization:     Assessment & Plan:   No diagnosis found.  Discussion: Keith Curtis is a 73 year old male, former smoker with COPD who is referred to pulmonary clinic for evaluation of dyspnea.   Chronic Obstructive Pulmonary Disease (COPD) / Asthma History of smoking with symptoms of shortness of breath, wheezing, and cough. Significant improvement with Trelegy. -Continue Trelegy one puff daily. -check overnight oxygen study  Lung Cancer Screening History of heavy smoking. -had scan done in 08/2022 with 3 month follow up recommended  Follow-up in 6 months  Duaine German, MD [facility] Office: [phone]    Current Outpatient Medications:    albuterol  (VENTOLIN  HFA) 108 (90 Base) MCG/ACT inhaler, INHALE 2 PUFFS INTO THE LUNGS EVERY 4 HOURS AS NEEDED FOR WHEEZING OR SHORTNESS OF BREATH, Disp: 6.7 g, Rfl: 3   clobetasol cream (TEMOVATE) 0.05 %, Apply topically., Disp: , Rfl:    Fluticasone -Umeclidin-Vilant (TRELEGY ELLIPTA ) 200-62.5-25 MCG/ACT AEPB, Inhale 1 Inhalation into the lungs daily., Disp: 30 each, Rfl: 11   sildenafil (VIAGRA) 50 MG tablet, Take 20 mg by mouth daily as needed for erectile dysfunction., Disp: , Rfl:    triamcinolone  cream (KENALOG ) 0.1 %, Apply 1 application topically 2 (two) times daily. For 7-10 days maximum, Disp: 30 g, Rfl: 0

## 2023-12-11 NOTE — Patient Instructions (Addendum)
 Continue trelegy ellipta  1 puff daily - rinse mouth out after each use  Use albuterol  inhaler 1-2 puffs every 4-6 hours as needed  Consider using oxygen 2L at night when sleeping based on the overnight oxygen testing. Not wearing oxygen overtime with known low oxygen levels at night can lead to heart failure and worsening brain function  Follow up in 1 year

## 2023-12-12 ENCOUNTER — Encounter: Payer: Self-pay | Admitting: Pulmonary Disease

## 2023-12-17 DIAGNOSIS — D225 Melanocytic nevi of trunk: Secondary | ICD-10-CM | POA: Diagnosis not present

## 2023-12-17 DIAGNOSIS — L570 Actinic keratosis: Secondary | ICD-10-CM | POA: Diagnosis not present

## 2023-12-17 DIAGNOSIS — Z85820 Personal history of malignant melanoma of skin: Secondary | ICD-10-CM | POA: Diagnosis not present

## 2023-12-17 DIAGNOSIS — Z1283 Encounter for screening for malignant neoplasm of skin: Secondary | ICD-10-CM | POA: Diagnosis not present

## 2023-12-17 DIAGNOSIS — Z08 Encounter for follow-up examination after completed treatment for malignant neoplasm: Secondary | ICD-10-CM | POA: Diagnosis not present

## 2023-12-17 DIAGNOSIS — X32XXXD Exposure to sunlight, subsequent encounter: Secondary | ICD-10-CM | POA: Diagnosis not present

## 2023-12-21 ENCOUNTER — Ambulatory Visit: Payer: Self-pay

## 2023-12-21 NOTE — Telephone Encounter (Signed)
 Copied from CRM [id number]. Topic: Clinical - Red Word Triage >> Dec 21, 2023  1:21 PM Caliyah H wrote: [facility] that prompted transfer to Nurse Triage: Patient is requesting a same-day appointment for today.He reports severe back pain that has been ongoing for a couple of weeks. He states the pain occurs primarily when he is in certain positions and describes it as feeling like a knife is stabbing him.   Chief Complaint: Back pain Symptoms:  Right mid back pain Frequency: Intermittent  Pertinent Negatives: Patient denies numbness, weakness, loss of bowel or bladder control Disposition: [] ED /[] Urgent Care (no appt availability in office) / [x] Appointment(In office/virtual)/ []  [facility]/ [] Home Care/ [] Refused Recommended Disposition /[] [facility]/ []  Follow-up with PCP Additional Notes: Patient reports he has had right mid back pain for approximately 1 month. He states his pain is intermittent and is only present when bending or moving certain ways. He denies any associated symptoms with his back pain. Appointment made for the patient on Monday for evaluation.     Reason for Disposition  [1] MODERATE back pain (e.g., interferes with normal activities) AND [2] present > 3 days  Answer Assessment - Initial Assessment Questions 1. ONSET: "When did the pain begin?"      1 month  2. LOCATION: "Where does it hurt?" (upper, mid or lower back)     Right middle back  3. SEVERITY: "How bad is the pain?"  (e.g., Scale 1-10; mild, moderate, or severe)   - MILD (1-3): Doesn't interfere with normal activities.    - MODERATE (4-7): Interferes with normal activities or awakens from sleep.    - SEVERE (8-10): Excruciating pain, unable to do any normal activities.      Moderate to severe  4. PATTERN: "Is the pain constant?" (e.g., yes, no; constant, intermittent)      Intermittent with certain movements  5. RADIATION: "Does the pain shoot into your legs or somewhere else?"      Radiating to side 6. CAUSE:  "What do you think is causing the back pain?"      Unsure  7. BACK OVERUSE:  "Any recent lifting of heavy objects, strenuous work or exercise?"     Not hat he can think of 8. MEDICINES: "What have you taken so far for the pain?" (e.g., nothing, acetaminophen, NSAIDS)     Advil which helps  9. NEUROLOGIC SYMPTOMS: "Do you have any weakness, numbness, or problems with bowel/bladder control?"     No 10. OTHER SYMPTOMS: "Do you have any other symptoms?" (e.g., fever, abdomen pain, burning with urination, blood in urine)       No  Protocols used: Back Pain-A-AH

## 2023-12-24 ENCOUNTER — Encounter: Payer: Self-pay | Admitting: Family Medicine

## 2023-12-24 ENCOUNTER — Ambulatory Visit (INDEPENDENT_AMBULATORY_CARE_PROVIDER_SITE_OTHER): Admitting: Family Medicine

## 2023-12-24 ENCOUNTER — Ambulatory Visit (INDEPENDENT_AMBULATORY_CARE_PROVIDER_SITE_OTHER)

## 2023-12-24 VITALS — BP 119/76 | HR 70 | Temp 97.2°F | Ht 74.0 in | Wt 221.6 lb

## 2023-12-24 DIAGNOSIS — M47816 Spondylosis without myelopathy or radiculopathy, lumbar region: Secondary | ICD-10-CM | POA: Diagnosis not present

## 2023-12-24 DIAGNOSIS — J449 Chronic obstructive pulmonary disease, unspecified: Secondary | ICD-10-CM

## 2023-12-24 DIAGNOSIS — M4805 Spinal stenosis, thoracolumbar region: Secondary | ICD-10-CM | POA: Diagnosis not present

## 2023-12-24 DIAGNOSIS — R109 Unspecified abdominal pain: Secondary | ICD-10-CM

## 2023-12-24 DIAGNOSIS — M5136 Other intervertebral disc degeneration, lumbar region with discogenic back pain only: Secondary | ICD-10-CM | POA: Diagnosis not present

## 2023-12-24 NOTE — Progress Notes (Signed)
   Keith Curtis is a 73 y.o. male who presents today for an office visit.  Assessment/Plan:  Right Flank Pain  Actually musculoskeletal in etiology.  May have muscular strain related to his frequent coughing however nephrolithiasis is also on the differential.  We will check send out urinalysis and urine culture.  Will also check plain film today given that symptoms have been persistent for the last month or so.  It is okay for him to use over-the-counter meds as needed until we get results back.  If above workup is negative and symptoms persist would consider trial of prescription anti-inflammatories and muscle relaxer versus referral to PT or sports medicine.  We discussed reasons to return to care  COPD Patient frequently coughing on exam today.  May be contributing to his above back pain.  He is currently on Trelegy Ellipta  per PCP and pulmonology.    Subjective:  HPI:  See A/P for status of chronic conditions.  Patient is here today with 1 month of back pain.  Pain is intermittent in nature.  Only typically present when moving in certain positions.  Feels like a "stabbing" back. Pain does seem to be increasing in size. Pain intensity is about the same. No fevers or chills. No rash. He has been having more urinary frequency. Tried advil which has helped.  No other treatments tried.  He has been coughing a lot more recently due to his COPD and he is following with pulmonology for this.  He did have a CT scan performed about a month ago to follow-up on a pulmonary nodule.  The official read on this is not back.       Objective:  Physical Exam: BP 119/76   Pulse 70   Temp (!) 97.2 F (36.2 C) (Temporal)   Ht 6\' 2"  (1.88 m)   Wt 221 lb 9.6 oz (100.5 kg)   SpO2 94%   BMI 28.45 kg/m   Gen: No acute distress, resting comfortably MUSCULOSKELETAL - Back: No deformities.  No rashes.  Tenderness to palpation along right upper thoracic and lower lumbar paraspinal muscle.  Neurovascular intact  distally. Neuro: Grossly normal, moves all extremities Psych: Normal affect and thought content      Rhett Najera M. Daneil Dunker, MD 12/24/2023 2:08 PM

## 2023-12-24 NOTE — Patient Instructions (Addendum)
 It was very nice to see you today!  I am concerned that you may had a kidney stone. We will check a urine sample today.  Will also check an xray.   It is okay for use over-the-counter meds as needed for the pain until we get results back.  Return if symptoms worsen or fail to improve.   Take care, Dr Daneil Dunker  PLEASE NOTE:  If you had any lab tests, please let us  know if you have not heard back within a few days. You may see your results on mychart before we have a chance to review them but we will give you a call once they are reviewed by us .   If we ordered any referrals today, please let us  know if you have not heard from their office within the next week.   If you had any urgent prescriptions sent in today, please check with the pharmacy within an hour of our visit to make sure the prescription was transmitted appropriately.   Please try these tips to maintain a healthy lifestyle:  Eat at least 3 REAL meals and 1-2 snacks per day.  Aim for no more than 5 hours between eating.  If you eat breakfast, please do so within one hour of getting up.   Each meal should contain half fruits/vegetables, one quarter protein, and one quarter carbs (no bigger than a computer mouse)  Cut down on sweet beverages. This includes juice, soda, and sweet tea.   Drink at least 1 glass of water with each meal and aim for at least 8 glasses per day  Exercise at least 150 minutes every week.

## 2023-12-24 NOTE — Telephone Encounter (Signed)
Pt has ov today 

## 2023-12-25 LAB — URINE CULTURE
MICRO NUMBER:: 16442986
SPECIMEN QUALITY:: ADEQUATE

## 2023-12-25 LAB — URINALYSIS, ROUTINE W REFLEX MICROSCOPIC
Bilirubin Urine: NEGATIVE
Hgb urine dipstick: NEGATIVE
Ketones, ur: NEGATIVE
Nitrite: NEGATIVE
RBC / HPF: NONE SEEN (ref 0–?)
Specific Gravity, Urine: 1.02 (ref 1.000–1.030)
Total Protein, Urine: NEGATIVE
Urine Glucose: NEGATIVE
Urobilinogen, UA: 0.2 (ref 0.0–1.0)
pH: 6.5 (ref 5.0–8.0)

## 2023-12-26 ENCOUNTER — Other Ambulatory Visit: Payer: Self-pay | Admitting: *Deleted

## 2023-12-26 ENCOUNTER — Ambulatory Visit: Payer: Self-pay | Admitting: Family Medicine

## 2023-12-26 MED ORDER — CEPHALEXIN 500 MG PO CAPS: 500.0000 mg | ORAL_CAPSULE | Freq: Two times a day (BID) | ORAL | 0 refills | Status: AC

## 2023-12-26 NOTE — Progress Notes (Signed)
 His lab results indicate that he may have a urinary tract infection.  If he is still having pain recommend that we treat with Keflex 500 mg twice daily for 7 days.  If his pain has resolved we do not need to do anything further.

## 2023-12-26 NOTE — Progress Notes (Signed)
 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  As we discussed at his office visit I believe this is probably the source of his pain however I have slight concern for urinary tract infection as well per his other result note.  He should let us  know if his pain is not improving.  Recommend he come back and see me or his PCP later this week if not improving.

## 2023-12-27 ENCOUNTER — Other Ambulatory Visit: Payer: Self-pay

## 2023-12-27 DIAGNOSIS — Z87891 Personal history of nicotine dependence: Secondary | ICD-10-CM

## 2023-12-27 DIAGNOSIS — F17210 Nicotine dependence, cigarettes, uncomplicated: Secondary | ICD-10-CM

## 2023-12-27 DIAGNOSIS — Z122 Encounter for screening for malignant neoplasm of respiratory organs: Secondary | ICD-10-CM

## 2024-01-10 ENCOUNTER — Ambulatory Visit: Payer: PPO

## 2024-04-02 ENCOUNTER — Ambulatory Visit (INDEPENDENT_AMBULATORY_CARE_PROVIDER_SITE_OTHER)

## 2024-04-02 VITALS — Ht 73.5 in | Wt 221.0 lb

## 2024-04-02 DIAGNOSIS — Z0000 Encounter for general adult medical examination without abnormal findings: Secondary | ICD-10-CM

## 2024-04-02 NOTE — Patient Instructions (Signed)
 Keith Curtis , Thank you for taking time out of your busy schedule to complete your Annual Wellness Visit with me. I enjoyed our conversation and look forward to speaking with you again next year. I, as well as your care team,  appreciate your ongoing commitment to your health goals. Please review the following plan we discussed and let me know if I can assist you in the future. Your Game plan/ To Do List    Referrals: If you haven't heard from the office you've been referred to, please reach out to them at the phone provided.   Follow up Visits: We will see or speak with you next year for your Next Medicare AWV with our clinical staff Have you seen your provider in the last 6 months (3 months if uncontrolled diabetes)? Yes  Clinician Recommendations:  Aim for 30 minutes of exercise or brisk walking, 6-8 glasses of water, and 5 servings of fruits and vegetables each day.        This is a list of the screenings recommended for you:  Health Maintenance  Topic Date Due   COVID-19 Vaccine (3 - 2024-25 season) 04/15/2023   Medicare Annual Wellness Visit  01/04/2024   Flu Shot  03/14/2024   Screening for Lung Cancer  11/26/2024   Colon Cancer Screening  12/08/2024   Pneumococcal Vaccine for age over 56  Completed   Hepatitis C Screening  Completed   HPV Vaccine  Aged Out   Meningitis B Vaccine  Aged Out   DTaP/Tdap/Td vaccine  Discontinued   Zoster (Shingles) Vaccine  Discontinued    Advanced directives: (Copy Requested) Please bring a copy of your health care power of attorney and living will to the office to be added to your chart at your convenience. You can mail to [facility] [address] [place], [place] [address] or email to [email or web address] Advance Care Planning is important because it:  [x]  Makes sure you receive the medical care that is consistent with your values, goals, and preferences  [x]  It provides guidance to your family and loved ones and  reduces their decisional burden about whether or not they are making the right decisions based on your wishes.  Follow the link provided in your after visit summary or read over the paperwork we have mailed to you to help you started getting your Advance Directives in place. If you need assistance in completing these, please reach out to us  so that we can help you!  See attachments for Preventive Care and Fall Prevention Tips.

## 2024-04-02 NOTE — Progress Notes (Signed)
 Subjective:   Keith Curtis is a 73 y.o. who presents for a Medicare Wellness preventive visit.  As a reminder, Annual Wellness Visits don't include a physical exam, and some assessments may be limited, especially if this visit is performed virtually. We may recommend an in-person follow-up visit with your provider if needed.  Visit Complete: Virtual I connected with  Keith Curtis on 04/02/24 by a audio enabled telemedicine application and verified that I am speaking with the correct person using two identifiers.  Patient Location: Home  Provider Location: Home Office  I discussed the limitations of evaluation and management by telemedicine. The patient expressed understanding and agreed to proceed.  Vital Signs: Because this visit was a virtual/telehealth visit, some criteria may be missing or patient reported. Any vitals not documented were not able to be obtained and vitals that have been documented are patient reported.  VideoDeclined- This patient declined Librarian, academic. Therefore the visit was completed with audio only.  Persons Participating in Visit: Patient.  AWV Questionnaire: No: Patient Medicare AWV questionnaire was not completed prior to this visit.  Cardiac Risk Factors include: advanced age (>32men, >110 women);dyslipidemia;male gender     Objective:    Today's Vitals   04/02/24 1306  Weight: 221 lb (100.2 kg)  Height: 6' 1.5 (1.867 m)   Body mass index is 28.76 kg/m.     04/02/2024    1:09 PM 01/04/2023   12:38 PM 12/29/2021    1:49 PM 10/29/2020    1:49 PM 04/01/2020    1:52 PM 03/26/2019    3:16 PM 11/27/2017    2:26 PM  Advanced Directives  Does Patient Have a Medical Advance Directive? Yes Yes Yes No No No No   Type of Estate agent of [place];Living will Healthcare Power of [place];Living will Healthcare Power of [place];Living will      Copy of Healthcare Power of Attorney in Chart? No - copy  requested No - copy requested No - copy requested      Would patient like information on creating a medical advance directive?     Yes (MAU/Ambulatory/Procedural Areas - Information given) No - Patient declined  Yes (MAU/Ambulatory/Procedural Areas - Information given)      Data saved with a previous flowsheet row definition    Current Medications (verified) Outpatient Encounter Medications as of 04/02/2024  Medication Sig   albuterol  (VENTOLIN  HFA) 108 (90 Base) MCG/ACT inhaler INHALE 2 PUFFS INTO THE LUNGS EVERY 4 HOURS AS NEEDED FOR WHEEZING OR SHORTNESS OF BREATH   clobetasol cream (TEMOVATE) 0.05 % Apply topically.   Fluticasone -Umeclidin-Vilant (TRELEGY ELLIPTA ) 200-62.5-25 MCG/ACT AEPB Inhale 1 Inhalation into the lungs daily.   sildenafil (VIAGRA) 50 MG tablet Take 20 mg by mouth daily as needed for erectile dysfunction.   triamcinolone  cream (KENALOG ) 0.1 % Apply 1 application topically 2 (two) times daily. For 7-10 days maximum   No facility-administered encounter medications on file as of 04/02/2024.    Allergies (verified) Patient has no known allergies.   History: Past Medical History:  Diagnosis Date   Adenomatous polyps    Cancer (HCC) 2022   Melanoma-removed   COPD (chronic obstructive pulmonary disease) (HCC)    Erectile dysfunction    buys sildenafil from [place] or other- 40 or 60mg    Genital herpes    last outbreak 2012 or before   Hyperlipidemia    ON ROSUVASTATIN    Marijuana use    alomst on daily basis  Tobacco abuse    1.5 PPD. uses as a crutch. not ready to quit   Past Surgical History:  Procedure Laterality Date   APPENDECTOMY  1975   COLONOSCOPY  09/02/2021   CYST EXCISION     left shoulder blade   hernia bilateral     inguinal- both sides- 3 surgeries total. around 2009 last surgery.    POLYPECTOMY     TA + 09-02-2021   Family History  Problem Relation Age of Onset   Other Mother        died in 57s- unknown cause   Heart attack Father         died at age 66   Stroke Father        47   Colon polyps Maternal Uncle    Colon cancer Neg Hx    Esophageal cancer Neg Hx    Stomach cancer Neg Hx    Rectal cancer Neg Hx    Social History   Socioeconomic History   Marital status: Divorced    Spouse name: Not on file   Number of children: Not on file   Years of education: Not on file   Highest education level: Not on file  Occupational History   Occupation: Advertising account planner  Tobacco Use   Smoking status: Former    Current packs/day: 0.00    Average packs/day: 1 pack/day for 30.0 years (30.0 ttl pk-yrs)    Types: Cigarettes    Start date: 02/10/1992    Quit date: 02/09/2022    Years since quitting: 2.1   Smokeless tobacco: Never   Tobacco comments:    Had a cigarette at 1130 am today 09/02/21  Vaping Use   Vaping status: Never Used  Substance and Sexual Activity   Alcohol use: Not Currently    Alcohol/week: 4.0 standard drinks of alcohol    Types: 4 Standard drinks or equivalent per week    Comment: occassional   Drug use: Not Currently    Frequency: 7.0 times per week    Types: Marijuana    Comment: marijuana use daily   Sexual activity: Yes    Comment: GF already has genital herpes  Other Topics Concern   Not on file  Social History Narrative   Divorced. GF 10 years in 2018. Son from prior marriage. No grandkids.       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       HObbies: time with gf, playing guitar, time in [place]- [place] with GF. Enjoys hiking.    Social Drivers of Corporate investment banker Strain: Low Risk  (04/02/2024)   Overall Financial Resource Strain (CARDIA)    Difficulty of Paying Living Expenses: Not hard at all  Food Insecurity: No Food Insecurity (04/02/2024)   Hunger Vital Sign    Worried About Running Out of Food in the Last Year: Never true    Ran Out of Food in the Last Year: Never true  Transportation Needs: No  Transportation Needs (04/02/2024)   PRAPARE - Administrator, Civil Service (Medical): No    Lack of Transportation (Non-Medical): No  Physical Activity: Inactive (04/02/2024)   Exercise Vital Sign    Days of Exercise per Week: 0 days    Minutes of Exercise per Session: 0 min  Stress: No Stress Concern Present (04/02/2024)   [employer] of Occupational Health - Occupational Stress Questionnaire  Feeling of Stress: Not at all  Social Connections: Socially Isolated (04/02/2024)   Social Connection and Isolation Panel    Frequency of Communication with Friends and Family: More than three times a week    Frequency of Social Gatherings with Friends and Family: More than three times a week    Attends Religious Services: Never    Database administrator or Organizations: No    Attends Banker Meetings: Never    Marital Status: Divorced    Tobacco Counseling Counseling given: Not Answered Tobacco comments: Had a cigarette at 1130 am today 09/02/21    Clinical Intake:  Pre-visit preparation completed: Yes  Pain : No/denies pain     BMI - recorded: 28.76 Nutritional Status: BMI 25 -29 Overweight Nutritional Risks: None Diabetes: No  No results found for: HGBA1C   How often do you need to have someone help you when you read instructions, pamphlets, or other written materials from your doctor or pharmacy?: 1 - Never  Interpreter Needed?: No  Information entered by :: Ellouise Haws, LPN   Activities of Daily Living     04/02/2024    1:16 PM  In your present state of health, do you have any difficulty performing the following activities:  Hearing? 1  Comment HOH  Vision? 0  Difficulty concentrating or making decisions? 0  Walking or climbing stairs? 0  Dressing or bathing? 0  Doing errands, shopping? 0  Preparing Food and eating ? N  Using the Toilet? N  In the past six months, have you accidently leaked urine? N  Do you have problems with  loss of bowel control? N  Managing your Medications? N  Managing your Finances? N  Housekeeping or managing your Housekeeping? N    Patient Care Team: Katrinka Garnette KIDD, MD as PCP - General (Family Medicine) Kara Dorn NOVAK, MD as Consulting Physician (Pulmonary Disease) Hunsucker, Donnice SAUNDERS, MD as Consulting Physician (Pulmonary Disease)  I have updated your Care Teams any recent Medical Services you may have received from other providers in the past year.     Assessment:   This is a routine wellness examination for [place].  Hearing/Vision screen Hearing Screening - Comments:: HOH  Vision Screening - Comments:: Encouraged to follow up with provider   Goals Addressed               This Visit's Progress     Patient Stated        Patient Stated (pt-stated)        Maintain health and activity       Depression Screen     04/02/2024    1:17 PM 12/24/2023    2:05 PM 05/01/2023    3:27 PM 01/04/2023   12:38 PM 12/29/2021    1:47 PM 04/25/2021    2:21 PM 04/01/2020    1:49 PM  PHQ 2/9 Scores  PHQ - 2 Score 0 0 5 0 1 1 0  PHQ- 9 Score   13  1 2      Fall Risk      04/02/2024    1:11 PM 12/24/2023    2:05 PM 12/11/2023    3:56 PM 10/26/2023   11:37 AM 05/01/2023    3:27 PM  Fall Risk   Falls in the past year? 0 0 0 0 0  Number falls in past yr: 0 0   0  Injury with Fall? 0 0   0  Risk for fall due to :  No Fall Risks No Fall Risks   No Fall Risks  Follow up Falls prevention discussed    Falls evaluation completed    MEDICARE RISK AT HOME:  Medicare Risk at Home Any stairs in or around the home?: Yes If so, are there any without handrails?: No Home free of loose throw rugs in walkways, pet beds, electrical cords, etc?: Yes Adequate lighting in your home to reduce risk of falls?: No Life alert?: No Grab bars in the bathroom?: Yes Shower chair or bench in shower?: No Elevated toilet seat or a handicapped toilet?: No  TIMED UP AND GO:  Was the test performed?   No  Cognitive Function: 6CIT completed        04/02/2024    1:12 PM 01/04/2023   12:41 PM 12/29/2021    1:49 PM 04/01/2020    1:57 PM  6CIT Screen  What Year? 0 points 0 points 0 points 0 points  What month? 0 points 0 points 0 points 0 points  What time? 0 points 0 points 0 points   Count back from 20 0 points 0 points 0 points 0 points  Months in reverse 0 points 0 points 0 points 0 points  Repeat phrase 0 points 0 points 0 points 0 points  Total Score 0 points 0 points 0 points     Immunizations Immunization History  Administered Date(s) Administered   Moderna Sars-Covid-2 Vaccination 06/15/2020, 07/13/2020   PNEUMOCOCCAL CONJUGATE-20 12/13/2022    Screening Tests Health Maintenance  Topic Date Due   COVID-19 Vaccine (3 - 2024-25 season) 04/15/2023   INFLUENZA VACCINE  03/14/2024   Lung Cancer Screening  11/26/2024   Colonoscopy  12/08/2024   Medicare Annual Wellness (AWV)  04/02/2025   Pneumococcal Vaccine: 50+ Years  Completed   Hepatitis C Screening  Completed   HPV VACCINES  Aged Out   Meningococcal B Vaccine  Aged Out   DTaP/Tdap/Td  Discontinued   Zoster Vaccines- Shingrix  Discontinued    Health Maintenance  Health Maintenance Due  Topic Date Due   COVID-19 Vaccine (3 - 2024-25 season) 04/15/2023   INFLUENZA VACCINE  03/14/2024   Health Maintenance Items Addressed: See Nurse Notes at the end of this note  Additional Screening:  Vision Screening: Recommended annual ophthalmology exams for early detection of glaucoma and other disorders of the eye. Would you like a referral to an eye doctor? No    Dental Screening: Recommended annual dental exams for proper oral hygiene  Community Resource Referral / Chronic Care Management: CRR required this visit?  No   CCM required this visit?  No   Plan:    I have personally reviewed and noted the following in the patient's chart:   Medical and social history Use of alcohol, tobacco or illicit drugs   Current medications and supplements including opioid prescriptions. Patient is not currently taking opioid prescriptions. Functional ability and status Nutritional status Physical activity Advanced directives List of other physicians Hospitalizations, surgeries, and ER visits in previous 12 months Vitals Screenings to include cognitive, depression, and falls Referrals and appointments  In addition, I have reviewed and discussed with patient certain preventive protocols, quality metrics, and best practice recommendations. A written personalized care plan for preventive services as well as general preventive health recommendations were provided to patient.   Ellouise VEAR Haws, LPN   1/[id number]/7974   After Visit Summary: (MyChart) Due to this being a telephonic visit, the after visit summary with patients personalized plan was offered to  patient via MyChart   Notes: Nothing significant to report at this time.

## 2024-05-30 ENCOUNTER — Other Ambulatory Visit: Payer: Self-pay | Admitting: Family Medicine

## 2024-06-16 DIAGNOSIS — Z1283 Encounter for screening for malignant neoplasm of skin: Secondary | ICD-10-CM | POA: Diagnosis not present

## 2024-06-16 DIAGNOSIS — Z85820 Personal history of malignant melanoma of skin: Secondary | ICD-10-CM | POA: Diagnosis not present

## 2024-06-16 DIAGNOSIS — D225 Melanocytic nevi of trunk: Secondary | ICD-10-CM | POA: Diagnosis not present

## 2024-06-16 DIAGNOSIS — L708 Other acne: Secondary | ICD-10-CM | POA: Diagnosis not present

## 2024-06-16 DIAGNOSIS — Z08 Encounter for follow-up examination after completed treatment for malignant neoplasm: Secondary | ICD-10-CM | POA: Diagnosis not present

## 2024-12-15 ENCOUNTER — Ambulatory Visit: Admitting: Pulmonary Disease

## 2025-04-07 ENCOUNTER — Ambulatory Visit
